# Patient Record
Sex: Male | Born: 1940 | Race: White | Hispanic: No | Marital: Married | Smoking: Former smoker
Health system: Southern US, Community
[De-identification: ages and names within clinical notes are randomized; demographics above are authoritative.]

## PROBLEM LIST (undated history)

## (undated) DIAGNOSIS — M109 Gout, unspecified: Secondary | ICD-10-CM

## (undated) DIAGNOSIS — I10 Essential (primary) hypertension: Secondary | ICD-10-CM

## (undated) DIAGNOSIS — Z8489 Family history of other specified conditions: Secondary | ICD-10-CM

## (undated) DIAGNOSIS — C189 Malignant neoplasm of colon, unspecified: Secondary | ICD-10-CM

## (undated) DIAGNOSIS — C801 Malignant (primary) neoplasm, unspecified: Secondary | ICD-10-CM

## (undated) DIAGNOSIS — E785 Hyperlipidemia, unspecified: Secondary | ICD-10-CM

## (undated) HISTORY — DX: Gout, unspecified: M10.9

## (undated) HISTORY — PX: LITHOTRIPSY: SUR834

## (undated) HISTORY — PX: PARTIAL COLECTOMY: SHX5273

## (undated) HISTORY — DX: Essential (primary) hypertension: I10

## (undated) HISTORY — PX: NASAL POLYP SURGERY: SHX186

## (undated) HISTORY — DX: Malignant (primary) neoplasm, unspecified: C80.1

## (undated) HISTORY — DX: Hyperlipidemia, unspecified: E78.5

---

## 2000-07-28 ENCOUNTER — Other Ambulatory Visit: Admission: RE | Admit: 2000-07-28 | Discharge: 2000-07-28 | Payer: Self-pay | Admitting: Gastroenterology

## 2000-08-01 ENCOUNTER — Encounter: Payer: Self-pay | Admitting: General Surgery

## 2000-08-06 ENCOUNTER — Inpatient Hospital Stay (HOSPITAL_COMMUNITY): Admission: RE | Admit: 2000-08-06 | Discharge: 2000-08-13 | Payer: Self-pay | Admitting: General Surgery

## 2003-10-13 ENCOUNTER — Ambulatory Visit: Payer: Self-pay | Admitting: Internal Medicine

## 2003-11-08 ENCOUNTER — Ambulatory Visit: Payer: Self-pay | Admitting: Internal Medicine

## 2004-10-11 ENCOUNTER — Ambulatory Visit: Payer: Self-pay | Admitting: Oncology

## 2004-11-07 ENCOUNTER — Ambulatory Visit: Payer: Self-pay | Admitting: Oncology

## 2005-10-24 ENCOUNTER — Ambulatory Visit: Payer: Self-pay | Admitting: Oncology

## 2005-11-07 ENCOUNTER — Ambulatory Visit: Payer: Self-pay | Admitting: Oncology

## 2007-11-23 ENCOUNTER — Ambulatory Visit: Payer: Self-pay | Admitting: Gastroenterology

## 2013-07-22 ENCOUNTER — Emergency Department: Payer: Self-pay | Admitting: Emergency Medicine

## 2013-07-22 LAB — URINALYSIS, COMPLETE
Bacteria: NONE SEEN
Bilirubin,UR: NEGATIVE
Glucose,UR: NEGATIVE mg/dL (ref 0–75)
Ketone: NEGATIVE
Leukocyte Esterase: NEGATIVE
Nitrite: NEGATIVE
Ph: 5 (ref 4.5–8.0)
Protein: NEGATIVE
RBC,UR: NONE SEEN /HPF (ref 0–5)
Specific Gravity: 1.005 (ref 1.003–1.030)
Squamous Epithelial: NONE SEEN
WBC UR: 1 /HPF (ref 0–5)

## 2013-07-22 LAB — COMPREHENSIVE METABOLIC PANEL
Albumin: 3.8 g/dL (ref 3.4–5.0)
Alkaline Phosphatase: 59 U/L
Anion Gap: 9 (ref 7–16)
BUN: 11 mg/dL (ref 7–18)
Bilirubin,Total: 0.6 mg/dL (ref 0.2–1.0)
Calcium, Total: 8.3 mg/dL — ABNORMAL LOW (ref 8.5–10.1)
Chloride: 104 mmol/L (ref 98–107)
Co2: 23 mmol/L (ref 21–32)
Creatinine: 1.57 mg/dL — ABNORMAL HIGH (ref 0.60–1.30)
EGFR (African American): 51 — ABNORMAL LOW
EGFR (Non-African Amer.): 44 — ABNORMAL LOW
Glucose: 124 mg/dL — ABNORMAL HIGH (ref 65–99)
Osmolality: 273 (ref 275–301)
Potassium: 4 mmol/L (ref 3.5–5.1)
SGOT(AST): 29 U/L (ref 15–37)
SGPT (ALT): 29 U/L (ref 12–78)
Sodium: 136 mmol/L (ref 136–145)
Total Protein: 6.9 g/dL (ref 6.4–8.2)

## 2013-07-22 LAB — CBC
HCT: 40.8 % (ref 40.0–52.0)
HGB: 13.6 g/dL (ref 13.0–18.0)
MCH: 32.6 pg (ref 26.0–34.0)
MCHC: 33.3 g/dL (ref 32.0–36.0)
MCV: 98 fL (ref 80–100)
Platelet: 213 10*3/uL (ref 150–440)
RBC: 4.16 10*6/uL — ABNORMAL LOW (ref 4.40–5.90)
RDW: 14.4 % (ref 11.5–14.5)
WBC: 11.9 10*3/uL — ABNORMAL HIGH (ref 3.8–10.6)

## 2013-07-23 ENCOUNTER — Ambulatory Visit: Payer: Self-pay | Admitting: Urology

## 2013-08-17 ENCOUNTER — Ambulatory Visit: Payer: Self-pay | Admitting: Urology

## 2013-08-19 ENCOUNTER — Ambulatory Visit: Payer: Self-pay | Admitting: Urology

## 2013-09-02 ENCOUNTER — Ambulatory Visit: Payer: Self-pay | Admitting: Urology

## 2014-04-30 NOTE — Op Note (Signed)
PATIENT NAME:  Edward Baird, Edward Baird MR#:  [id number] DATE OF BIRTH:  May 12, 1940  DATE OF PROCEDURE:  07/23/2013  PREOPERATIVE DIAGNOSIS: Left upper ureteral stone with obstruction.  POSTOPERATIVE DIAGNOSIS:  Left upper ureteral stone with obstruction.  PROCEDURE: Cystoscopy with a left stent placement.  PROCEDURE IN DETAIL:  With the patient sterilely prepped and draped in supine lithotomy position under good relaxation from general anesthesia, we do a standard timeout and determine the left ureter to be the target area.  The patient has been previously marked and has preoperative antibiotics. Cystoscopy is done utilizing a 21 French cystoscope with a four oblique lens. 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 Ureters are in normal position and the left ureter is instrumented with 0.035 Glidewire. Over the Glidewire, a Percuflex 26 cm stent was placed. Good position in the kidney and in the bladder determined by my scope. He has a 30 mL of Marcaine placed in and a B and O suppository. The markings in the bladder, the B and O suppositories in the rectum. He has a very small prostate on rectal exam. No rectal masses, tumors, or growths. He was sent to recovery with an empty bladder in satisfactory condition with a stent in good position by fluoroscopy.   ____________________________ Janice Coffin. Elnoria Howard, DO rdh:ds D: 07/23/2013 17:24:22 ET T: 07/23/2013 23:16:55 ET JOB#: [id number]  cc: Janice Coffin. Elnoria Howard, DO, <Dictator> Rekia Kujala D Blandon Offerdahl DO ELECTRONICALLY SIGNED 08/05/2013 14:07

## 2014-05-26 LAB — CBC AND DIFFERENTIAL
HEMATOCRIT: 44 % (ref 41–53)
HEMOGLOBIN: 15.2 g/dL (ref 13.5–17.5)
Neutrophils Absolute: 5 /uL
Platelets: 217 10*3/uL (ref 150–399)
WBC: 7 10*3/mL

## 2014-05-26 LAB — LIPID PANEL
Cholesterol: 262 mg/dL — AB (ref 0–200)
HDL: 88 mg/dL — AB (ref 35–70)
LDL Cholesterol: 148 mg/dL
LDl/HDL Ratio: 1.7
TRIGLYCERIDES: 131 mg/dL (ref 40–160)

## 2014-05-26 LAB — BASIC METABOLIC PANEL
BUN: 14 mg/dL (ref 4–21)
CREATININE: 1.3 mg/dL (ref 0.6–1.3)
Glucose: 99 mg/dL
POTASSIUM: 4.8 mmol/L (ref 3.4–5.3)
SODIUM: 140 mmol/L (ref 137–147)

## 2014-05-26 LAB — HEPATIC FUNCTION PANEL
ALK PHOS: 54 U/L (ref 25–125)
ALT: 22 U/L (ref 10–40)
AST: 20 U/L (ref 14–40)
BILIRUBIN, TOTAL: 0.6 mg/dL

## 2014-05-26 LAB — TSH: TSH: 2.15 u[IU]/mL (ref 0.41–5.90)

## 2014-05-26 LAB — PSA: PSA: 10

## 2014-08-23 ENCOUNTER — Other Ambulatory Visit: Payer: Self-pay

## 2014-09-23 ENCOUNTER — Ambulatory Visit: Payer: Self-pay | Admitting: Urology

## 2015-02-02 DIAGNOSIS — N400 Enlarged prostate without lower urinary tract symptoms: Secondary | ICD-10-CM | POA: Insufficient documentation

## 2015-02-02 DIAGNOSIS — M1990 Unspecified osteoarthritis, unspecified site: Secondary | ICD-10-CM | POA: Insufficient documentation

## 2015-02-02 DIAGNOSIS — R972 Elevated prostate specific antigen [PSA]: Secondary | ICD-10-CM | POA: Insufficient documentation

## 2015-02-02 DIAGNOSIS — M109 Gout, unspecified: Secondary | ICD-10-CM | POA: Insufficient documentation

## 2015-02-02 DIAGNOSIS — C189 Malignant neoplasm of colon, unspecified: Secondary | ICD-10-CM | POA: Insufficient documentation

## 2015-02-02 DIAGNOSIS — E7800 Pure hypercholesterolemia, unspecified: Secondary | ICD-10-CM | POA: Insufficient documentation

## 2015-02-02 DIAGNOSIS — C4490 Unspecified malignant neoplasm of skin, unspecified: Secondary | ICD-10-CM | POA: Insufficient documentation

## 2015-02-02 DIAGNOSIS — Z87442 Personal history of urinary calculi: Secondary | ICD-10-CM | POA: Insufficient documentation

## 2015-02-02 DIAGNOSIS — I10 Essential (primary) hypertension: Secondary | ICD-10-CM | POA: Insufficient documentation

## 2015-03-13 ENCOUNTER — Other Ambulatory Visit: Payer: Self-pay

## 2015-03-13 MED ORDER — LISINOPRIL 10 MG PO TABS: 10.0000 mg | ORAL_TABLET | Freq: Every day | ORAL | Status: DC

## 2015-03-13 MED ORDER — ALLOPURINOL 100 MG PO TABS: 100.0000 mg | ORAL_TABLET | Freq: Two times a day (BID) | ORAL | Status: DC

## 2015-03-13 NOTE — Telephone Encounter (Signed)
Patient was scheduled to come in for CPE on 03/14/15 but he is not due till after may 19th, will schedule been blocked off i was able to schedule patient for may 30th. He was going to be out of his medications so i sent enough refills till he has to come in-aa

## 2015-03-14 ENCOUNTER — Encounter: Payer: Self-pay | Admitting: Family Medicine

## 2015-03-14 DIAGNOSIS — C44529 Squamous cell carcinoma of skin of other part of trunk: Secondary | ICD-10-CM | POA: Diagnosis not present

## 2015-03-14 DIAGNOSIS — D485 Neoplasm of uncertain behavior of skin: Secondary | ICD-10-CM | POA: Diagnosis not present

## 2015-03-21 DIAGNOSIS — C44529 Squamous cell carcinoma of skin of other part of trunk: Secondary | ICD-10-CM | POA: Diagnosis not present

## 2015-03-21 DIAGNOSIS — L570 Actinic keratosis: Secondary | ICD-10-CM | POA: Diagnosis not present

## 2015-06-06 ENCOUNTER — Ambulatory Visit (INDEPENDENT_AMBULATORY_CARE_PROVIDER_SITE_OTHER): Payer: PPO | Admitting: Family Medicine

## 2015-06-06 ENCOUNTER — Encounter: Payer: Self-pay | Admitting: Family Medicine

## 2015-06-06 VITALS — BP 106/62 | HR 80 | Temp 97.6°F | Resp 16 | Ht 68.0 in | Wt 206.0 lb

## 2015-06-06 DIAGNOSIS — M109 Gout, unspecified: Secondary | ICD-10-CM

## 2015-06-06 DIAGNOSIS — R972 Elevated prostate specific antigen [PSA]: Secondary | ICD-10-CM

## 2015-06-06 DIAGNOSIS — Z0000 Encounter for general adult medical examination without abnormal findings: Secondary | ICD-10-CM | POA: Diagnosis not present

## 2015-06-06 DIAGNOSIS — I10 Essential (primary) hypertension: Secondary | ICD-10-CM

## 2015-06-06 DIAGNOSIS — E7800 Pure hypercholesterolemia, unspecified: Secondary | ICD-10-CM | POA: Diagnosis not present

## 2015-06-06 NOTE — Progress Notes (Signed)
Patient ID: Edward Baird, male   DOB: December 07, 1940, 75 y.o.   MRN: [id number] Patient: Edward Baird, Male    DOB: 01-May-1940, 75 y.o.   MRN: [id number] Visit Date: 06/06/2015  Today's Provider: Wilhemena Durie, MD   Chief Complaint  Patient presents with  . Annual Exam   Subjective:   Edward Baird is a 75 y.o. male who presents today for his Subsequent Annual Wellness Visit. He feels well. He reports exercising daily. He reports he is sleeping well. Pt stressed with marital separation of his daughter who has 3 children. He has no medical concerns and overall feels well. Review of Systems  Constitutional: Negative.   HENT: Negative.   Eyes: Negative.   Respiratory: Negative.   Cardiovascular: Negative.   Gastrointestinal: Negative.   Endocrine: Negative.   Genitourinary: Negative.   Musculoskeletal: Negative.   Skin: Negative.   Allergic/Immunologic: Negative.   Neurological: Negative.   Hematological: Negative.   Psychiatric/Behavioral: Negative.     Patient Active Problem List   Diagnosis Date Noted  . 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 Malignant neoplasm of colon ([place]) 02/02/2015  . Gout 02/02/2015  . Abnormal prostate specific antigen 02/02/2015  . Essential (primary) hypertension 02/02/2015  . Personal history of urinary calculi 02/02/2015  . Hypercholesteremia 02/02/2015  . Arthritis, degenerative 02/02/2015  . Malignant neoplasm of skin 02/02/2015    Social History   Social History  . Marital Status: Married    Spouse Name: N/A  . Number of Children: N/A  . Years of Education: N/A   Occupational History  . Not on file.   Social History Main Topics  . Smoking status: Not on file  . Smokeless tobacco: Not on file  . Alcohol Use: Not on file  . Drug Use: Not on file  . Sexual Activity: Not on file   Other Topics Concern  . Not on file   Social History Narrative  . No narrative on file    Past Surgical History  Procedure  Laterality Date  . Lithotripsy    . Partial colectomy    . Nasal polyp surgery      His family history includes Anemia in his sister; Dementia in his father; Heart disease in his father and mother; Hypertension in his mother.    Outpatient Prescriptions Prior to Visit  Medication Sig Dispense Refill  . allopurinol (ZYLOPRIM) 100 MG tablet Take 1 tablet (100 mg total) by mouth 2 (two) times daily. 60 tablet 2  . aspirin 81 MG tablet Take by mouth.    Marland Kitchen GARLIC 99991111 PO Take by mouth.    Marland Kitchen lisinopril (PRINIVIL,ZESTRIL) 10 MG tablet Take 1 tablet (10 mg total) by mouth daily. 30 tablet 2  . meloxicam (MOBIC) 7.5 MG tablet Take by mouth.    . Milk Thistle-Dand-Fennel-Licor (MILK THISTLE XTRA) CAPS Take by mouth.    . Misc Natural Products (SAW PALMETTO) CAPS Take by mouth.    . Omega-3 Fatty Acids (FISH OIL BURP-LESS) 1200 MG CAPS Take by mouth.     No facility-administered medications prior to visit.    No Known Allergies  Patient Care Team: Jerrol Banana., MD as PCP - General (Family Medicine)  Objective:   Vitals:  Filed Vitals:   06/06/15 1403  BP: 106/62  Pulse: 80  Temp: 97.6 F (36.4 C)  TempSrc: Oral  Resp: 16  Height: 5\' 8"  (1.727 m)  Weight: 206 lb (93.441 kg)    Physical Exam  Constitutional: He is oriented to person, place, and time. He appears well-developed and well-nourished.  HENT:  Head: Normocephalic and atraumatic.  Right Ear: External ear normal.  Left Ear: External ear normal.  Nose: Nose normal.  Mouth/Throat: Oropharynx is clear and moist.  Eyes: Conjunctivae and EOM are normal. Pupils are equal, round, and reactive to light.  Neck: Normal range of motion. Neck supple.  Cardiovascular: Normal rate, regular rhythm, normal heart sounds and intact distal pulses.   Pulmonary/Chest: Effort normal and breath sounds normal.  Abdominal: Soft. Bowel sounds are normal.  Musculoskeletal: Normal range of motion.  Neurological: He is alert and  oriented to person, place, and time.  Skin: Skin is warm and dry.  Psychiatric: He has a normal mood and affect. His behavior is normal. Judgment and thought content normal.    Activities of Daily Living In your present state of health, do you have any difficulty performing the following activities: 06/06/2015  Hearing? N  Vision? N  Difficulty concentrating or making decisions? N  Walking or climbing stairs? N  Dressing or bathing? N  Doing errands, shopping? N    Fall Risk Assessment Fall Risk  06/06/2015  Falls in the past year? No     Depression Screen PHQ 2/9 Scores 06/06/2015  PHQ - 2 Score 0    Cognitive Testing - 6-CIT    Year: 0 4 points  Month: 0 3 points  Memorize "Pia Mau, [address] [place]"  Time (within 1 hour:) 0 3 points  Count backwards from 20: 0 2 4 points  Name months of year: 0 2 4 points  Repeat Address: 0 2 4 6 8 10  points   Total Score: 6/28  Interpretation : Normal (0-7) Abnormal (8-28)    Assessment & Plan:     Annual Wellness Visit  Reviewed patient's Family Medical History Reviewed and updated list of patient's medical providers Assessment of cognitive impairment was done Assessed patient's functional ability Established a written schedule for health screening [place] Completed and Reviewed  Exercise Activities and Dietary recommendations Goals    None      Immunization History  Administered Date(s) Administered  . Influenza-Unspecified 10/21/2014  . Pneumococcal Conjugate-13 05/17/2013  . Pneumococcal Polysaccharide-23 10/08/2007  . Td 10/08/2007  . Zoster 10/08/2007    Health Maintenance  Topic Date Due  . COLONOSCOPY  03/14/1992  . INFLUENZA VACCINE  08/08/2015  . TETANUS/TDAP  10/07/2017  . ZOSTAVAX  Completed  . PNA vac Low Risk Adult  Completed      Discussed health benefits of physical activity, and encouraged him to engage in regular exercise appropriate for his age and  condition.   HTN Gout Colon cancer--s/p colectomy BPH Check labs.  Miguel Aschoff MD [facility] 06/06/2015 2:05 PM  ------------------------------------------------------------------------------------------------------------

## 2015-06-09 DIAGNOSIS — E7800 Pure hypercholesterolemia, unspecified: Secondary | ICD-10-CM | POA: Diagnosis not present

## 2015-06-09 DIAGNOSIS — I10 Essential (primary) hypertension: Secondary | ICD-10-CM | POA: Diagnosis not present

## 2015-06-10 LAB — LIPID PANEL WITH LDL/HDL RATIO
CHOLESTEROL TOTAL: 224 mg/dL — AB (ref 100–199)
HDL: 68 mg/dL (ref >39)
LDL CALC: 135 mg/dL — AB (ref 0–99)
LDl/HDL Ratio: 2 ratio units (ref 0.0–3.6)
Triglycerides: 107 mg/dL (ref 0–149)
VLDL Cholesterol Cal: 21 mg/dL (ref 5–40)

## 2015-06-10 LAB — CBC WITH DIFFERENTIAL/PLATELET
BASOS ABS: 0 10*3/uL (ref 0.0–0.2)
Basos: 1 %
EOS (ABSOLUTE): 0.2 10*3/uL (ref 0.0–0.4)
Eos: 4 %
HEMOGLOBIN: 14.2 g/dL (ref 12.6–17.7)
Hematocrit: 42.2 % (ref 37.5–51.0)
Immature Grans (Abs): 0 10*3/uL (ref 0.0–0.1)
Immature Granulocytes: 0 %
LYMPHS ABS: 1.5 10*3/uL (ref 0.7–3.1)
Lymphs: 25 %
MCH: 31.9 pg (ref 26.6–33.0)
MCHC: 33.6 g/dL (ref 31.5–35.7)
MCV: 95 fL (ref 79–97)
Monocytes Absolute: 0.6 10*3/uL (ref 0.1–0.9)
Monocytes: 9 %
NEUTROS ABS: 3.6 10*3/uL (ref 1.4–7.0)
Neutrophils: 61 %
PLATELETS: 215 10*3/uL (ref 150–379)
RBC: 4.45 x10E6/uL (ref 4.14–5.80)
RDW: 14.3 % (ref 12.3–15.4)
WBC: 5.9 10*3/uL (ref 3.4–10.8)

## 2015-06-10 LAB — COMPREHENSIVE METABOLIC PANEL
ALT: 19 IU/L (ref 0–44)
AST: 18 IU/L (ref 0–40)
Albumin/Globulin Ratio: 2 (ref 1.2–2.2)
Albumin: 4.5 g/dL (ref 3.5–4.8)
Alkaline Phosphatase: 52 IU/L (ref 39–117)
BUN/Creatinine Ratio: 12 (ref 10–24)
BUN: 15 mg/dL (ref 8–27)
Bilirubin Total: 0.6 mg/dL (ref 0.0–1.2)
CALCIUM: 9.3 mg/dL (ref 8.6–10.2)
CO2: 21 mmol/L (ref 18–29)
CREATININE: 1.3 mg/dL — AB (ref 0.76–1.27)
Chloride: 103 mmol/L (ref 96–106)
GFR calc Af Amer: 63 mL/min/{1.73_m2} (ref >59)
GFR, EST NON AFRICAN AMERICAN: 54 mL/min/{1.73_m2} — AB (ref >59)
GLOBULIN, TOTAL: 2.2 g/dL (ref 1.5–4.5)
Glucose: 113 mg/dL — ABNORMAL HIGH (ref 65–99)
Potassium: 4.6 mmol/L (ref 3.5–5.2)
SODIUM: 142 mmol/L (ref 134–144)
Total Protein: 6.7 g/dL (ref 6.0–8.5)

## 2015-06-10 LAB — URIC ACID: URIC ACID: 7.7 mg/dL (ref 3.7–8.6)

## 2015-06-10 LAB — TSH: TSH: 2.09 u[IU]/mL (ref 0.450–4.500)

## 2015-06-21 DIAGNOSIS — Z85828 Personal history of other malignant neoplasm of skin: Secondary | ICD-10-CM | POA: Diagnosis not present

## 2015-06-21 DIAGNOSIS — Z872 Personal history of diseases of the skin and subcutaneous tissue: Secondary | ICD-10-CM | POA: Diagnosis not present

## 2015-06-21 DIAGNOSIS — Z1283 Encounter for screening for malignant neoplasm of skin: Secondary | ICD-10-CM | POA: Diagnosis not present

## 2015-06-21 DIAGNOSIS — Z08 Encounter for follow-up examination after completed treatment for malignant neoplasm: Secondary | ICD-10-CM | POA: Diagnosis not present

## 2015-06-21 DIAGNOSIS — L570 Actinic keratosis: Secondary | ICD-10-CM | POA: Diagnosis not present

## 2015-06-27 ENCOUNTER — Other Ambulatory Visit: Payer: Self-pay | Admitting: Family Medicine

## 2015-07-17 ENCOUNTER — Other Ambulatory Visit: Payer: Self-pay | Admitting: Family Medicine

## 2015-11-22 IMAGING — CR DG ABDOMEN 1V
1 series · 1 of 1 positions shown · non-contrast
Comparison: KUB 08/17/2013.  CT abdomen and pelvis 07/22/2013.

CLINICAL DATA: Renal stones.

EXAM:
ABDOMEN - 1 VIEW

[kdxr kidney ureter bladder]
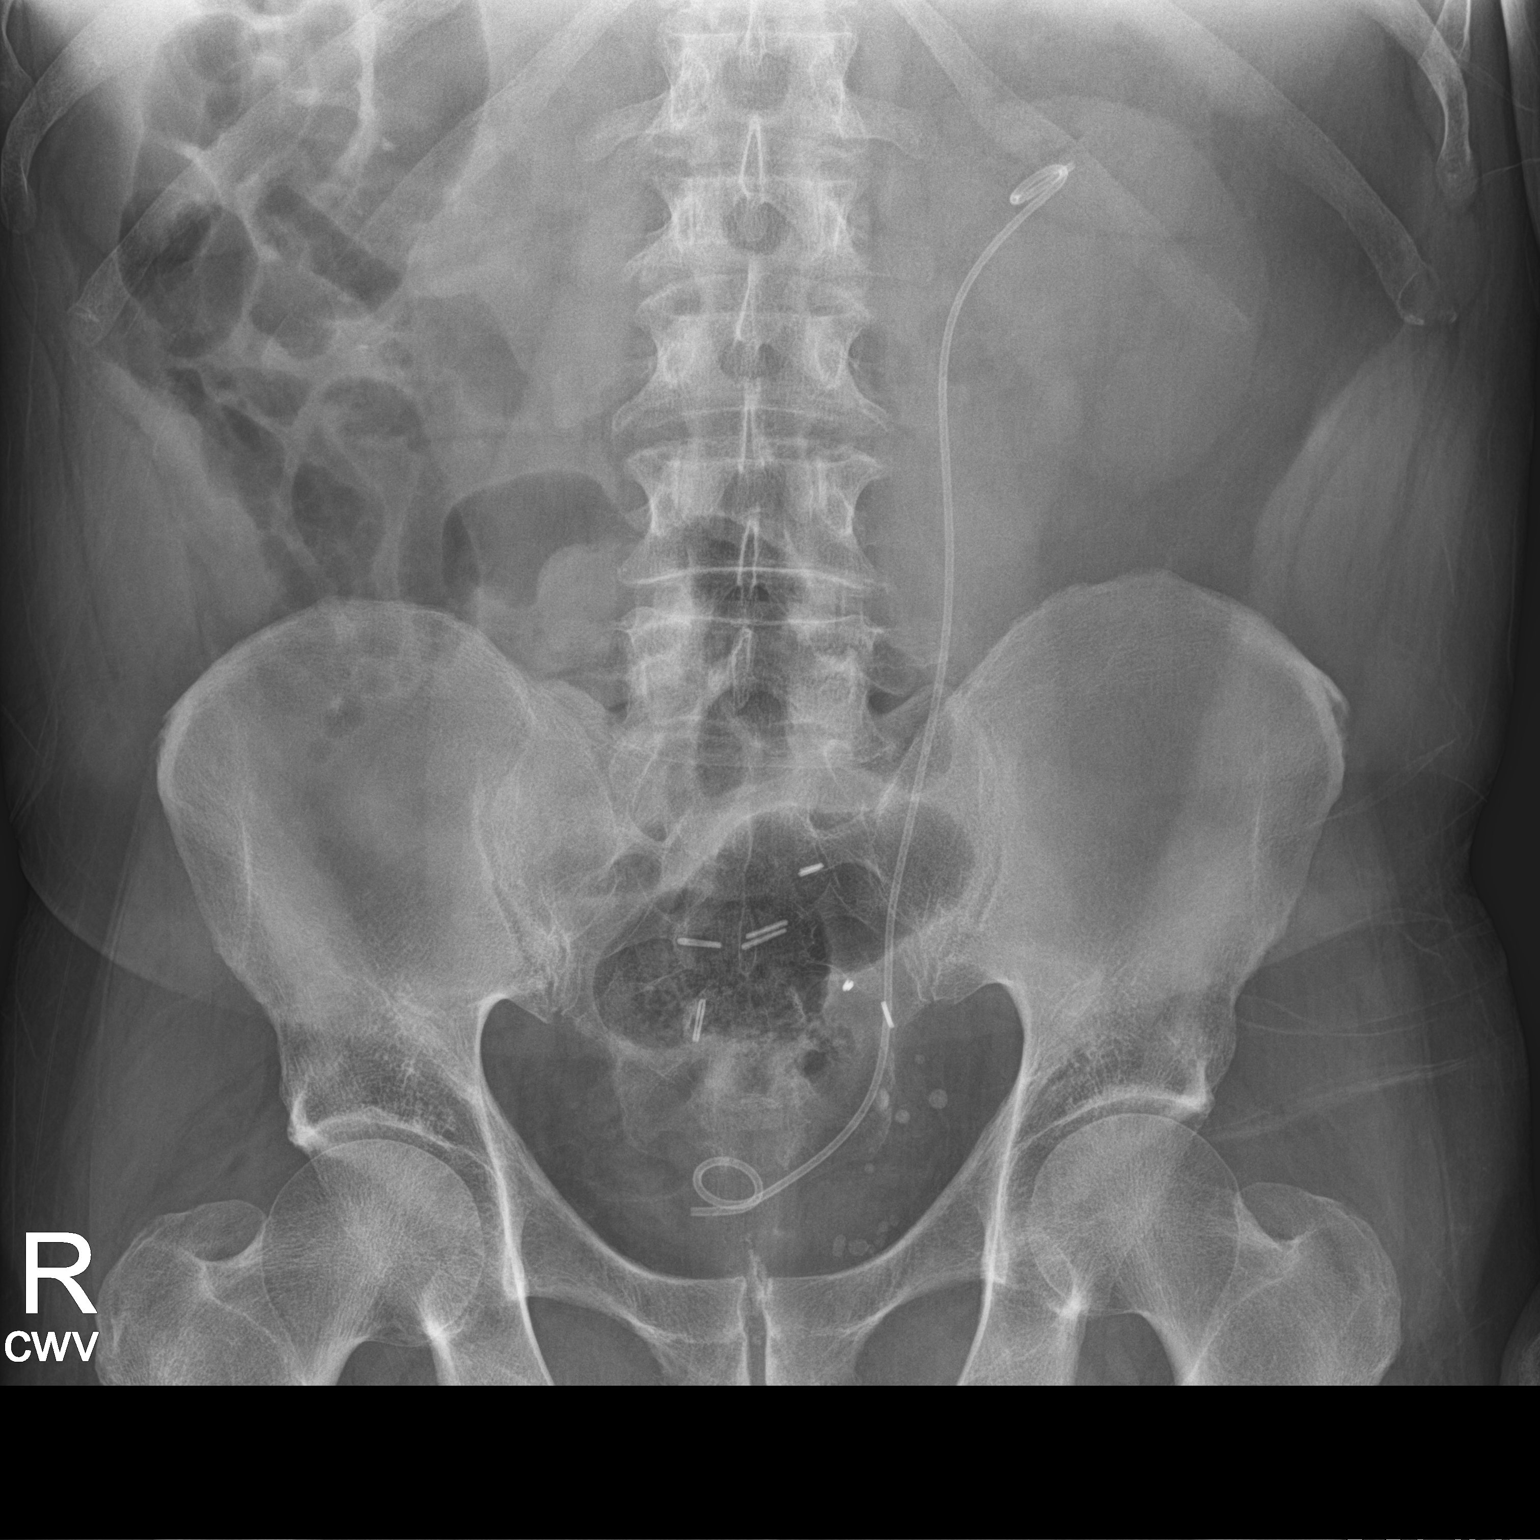

[1 of 1 positions shown; findings below may reference images not displayed]

FINDINGS: Left double-J ureteral stent is in place as on the most recent plain
film. Punctate calcifications in the right upper quadrant correlate
with nonobstructing right renal stones seen on CT scan. Tiny left
renal stones seen on CT are not visible on this examination. There
is a calcification along the patient's left double-J ureteral stent
just proximal to the UVJ which likely represents a distal left
ureteral stone rather than a phlebolith. The stone is not readily
seen on the prior examination.
IMPRESSION: A calcification is seen along the distal aspect of the patient's
left double-J ureteral stent. This is favored to represent a distal
left ureteral stone although the patient has multiple pelvic
phleboliths on the left.

Nonobstructing stones right kidney.

## 2016-01-16 ENCOUNTER — Ambulatory Visit (INDEPENDENT_AMBULATORY_CARE_PROVIDER_SITE_OTHER): Payer: PPO | Admitting: Family Medicine

## 2016-01-16 ENCOUNTER — Ambulatory Visit
Admission: RE | Admit: 2016-01-16 | Discharge: 2016-01-16 | Disposition: A | Discharge status: Home / Self Care | Payer: PPO | Source: Ambulatory Visit | Attending: Family Medicine | Admitting: Family Medicine

## 2016-01-16 ENCOUNTER — Encounter: Payer: Self-pay | Admitting: Family Medicine

## 2016-01-16 VITALS — BP 126/92 | HR 102 | Temp 97.9°F | Resp 16 | Wt 202.2 lb

## 2016-01-16 DIAGNOSIS — R1033 Periumbilical pain: Secondary | ICD-10-CM

## 2016-01-16 DIAGNOSIS — Z85038 Personal history of other malignant neoplasm of large intestine: Secondary | ICD-10-CM | POA: Diagnosis not present

## 2016-01-16 DIAGNOSIS — R197 Diarrhea, unspecified: Secondary | ICD-10-CM

## 2016-01-16 DIAGNOSIS — R10819 Abdominal tenderness, unspecified site: Secondary | ICD-10-CM | POA: Diagnosis not present

## 2016-01-16 DIAGNOSIS — R935 Abnormal findings on diagnostic imaging of other abdominal regions, including retroperitoneum: Secondary | ICD-10-CM | POA: Insufficient documentation

## 2016-01-16 NOTE — Progress Notes (Signed)
Patient: Edward Baird Male    DOB: 10-Jul-1940   76 y.o.   MRN: [id number] Visit Date: 01/16/2016  Today's Provider: Vernie Murders, PA   Chief Complaint  Patient presents with  . Emesis  . Diarrhea   Subjective:    Emesis   This is a new problem. Episode onset: diarrhea started on Thursday and vomitting started this morning. The problem occurs 2 to 4 times per day. The problem has been gradually worsening. The emesis has an appearance of bile (green). There has been no fever. Associated symptoms include abdominal pain and diarrhea. Associated symptoms comments: Loss of appetite,cramping . He has tried increased fluids for the symptoms. The treatment provided no relief.  Diarrhea   This is a new problem. Episode onset: Thursday. The problem occurs more than 10 times per day. The problem has been unchanged. The stool consistency is described as watery. Associated symptoms include abdominal pain and vomiting. There are no known risk factors. He has tried increased fluids for the symptoms. The treatment provided no relief. history of colon cancer in 2002   No past medical history on file. Patient Active Problem List   Diagnosis Date Noted  . 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 Malignant neoplasm of colon ([place]) 02/02/2015  . Gout 02/02/2015  . Abnormal prostate specific antigen 02/02/2015  . Essential (primary) hypertension 02/02/2015  . Personal history of urinary calculi 02/02/2015  . Hypercholesteremia 02/02/2015  . Arthritis, degenerative 02/02/2015  . Malignant neoplasm of skin 02/02/2015   Past Surgical History:  Procedure Laterality Date  . LITHOTRIPSY    . NASAL POLYP SURGERY    . PARTIAL COLECTOMY     Family History  Problem Relation Age of Onset  . Hypertension Mother   . Heart disease Mother   . Heart disease Father   . Dementia Father   . Anemia Sister    No Known Allergies   Previous Medications   ALLOPURINOL (ZYLOPRIM) 100 MG TABLET    Take 1 tablet  (100 mg total) by mouth 2 (two) times daily.   ASPIRIN 81 MG TABLET    Take by mouth.   GARLIC 99991111 PO    Take by mouth.   LISINOPRIL (PRINIVIL,ZESTRIL) 10 MG TABLET    Take 1 tablet (10 mg total) by mouth daily.   MELOXICAM (MOBIC) 7.5 MG TABLET    Take by mouth.   MILK THISTLE-DAND-FENNEL-LICOR (MILK THISTLE XTRA) CAPS    Take by mouth.   MISC NATURAL PRODUCTS (SAW PALMETTO) CAPS    Take by mouth.   OMEGA-3 FATTY ACIDS (FISH OIL BURP-LESS) 1200 MG CAPS    Take by mouth.    Review of Systems  Constitutional: Positive for appetite change.  Respiratory: Negative.   Cardiovascular: Negative.   Gastrointestinal: Positive for abdominal pain, diarrhea and vomiting.    Social History  Substance Use Topics  . Smoking status: Former Smoker    Quit date: 01/06/1994  . Smokeless tobacco: Not on file  . Alcohol use 0.0 oz/week     Comment: 10 per week   Objective:   BP (!) 126/92 (BP Location: Right Arm, Patient Position: Sitting, Cuff Size: Normal)   Pulse (!) 102   Temp 97.9 F (36.6 C) (Oral)   Resp 16   Wt 202 lb 3.2 oz (91.7 kg)   SpO2 98%   BMI 30.74 kg/m  Wt Readings from Last 3 Encounters:  01/16/16 202 lb 3.2 oz (91.7 kg)  06/06/15 206 lb (93.4  kg)  05/26/14 205 lb (93 kg)    Physical Exam  Constitutional: He is oriented to person, place, and time. He appears well-developed and well-nourished.  HENT:  Head: Normocephalic.  Right Ear: External ear normal.  Left Ear: External ear normal.  Nose: Nose normal.  Mouth/Throat: Oropharynx is clear and moist.  Neck: Neck supple.  Cardiovascular: Normal rate and regular rhythm.   Pulmonary/Chest: Effort normal and breath sounds normal.  Abdominal: Soft. He exhibits no mass. There is tenderness.  Generalized tenderness more prominent in the periumbilical region with slightly high pitches bowel sounds.  Lymphadenopathy:    He has no cervical adenopathy.  Neurological: He is alert and oriented to person, place, and time.    Psychiatric: He has a normal mood and affect. His behavior is normal. Thought content normal.      Assessment & Plan:     1. Periumbilical abdominal pain Onset 4-5 days ago with cramping and green diarrhea stools. Had some nausea and vomited green "bile" this morning 3 times. No longer feels as nauseated now. Abdominal pain is crampy. Usually has soft to liquid stools every 3 hours since having the majority of his colon removed due to colon cancer in 2015. No recent use of antibiotics. Feeling fatigued but no fever. Will check KUB for signs of obstruction, get labs to rule out infectious diarrhea and dehydration. May use the Imodium he normally uses to control multiple loose stools if x-ray doesn't show obstruction. Increase fluid intake and bland diet. Home to rest. - CBC with Differential/Platelet - Comprehensive metabolic panel - DG Abd 2 Views  2. Diarrhea, unspecified type Worsening with "green" color over the past few days. No blood in stools or fever. Will get stool culture, CBC and CMP. - CBC with Differential/Platelet - Comprehensive metabolic panel - Stool Culture  3. Hx of malignant neoplasm of colon "Only have about 8 inches left since resection for colon cancer." (surgeon - Dr. Rebekah Chesterfield in [place]).

## 2016-01-17 ENCOUNTER — Telehealth: Payer: Self-pay | Admitting: Family Medicine

## 2016-01-17 LAB — CBC WITH DIFFERENTIAL/PLATELET
BASOS ABS: 0 10*3/uL (ref 0.0–0.2)
Basos: 0 %
EOS (ABSOLUTE): 0.1 10*3/uL (ref 0.0–0.4)
Eos: 1 %
Hematocrit: 46.6 % (ref 37.5–51.0)
Hemoglobin: 15.8 g/dL (ref 13.0–17.7)
IMMATURE GRANS (ABS): 0 10*3/uL (ref 0.0–0.1)
Immature Granulocytes: 0 %
LYMPHS: 17 %
Lymphocytes Absolute: 1.2 10*3/uL (ref 0.7–3.1)
MCH: 31.5 pg (ref 26.6–33.0)
MCHC: 33.9 g/dL (ref 31.5–35.7)
MCV: 93 fL (ref 79–97)
Monocytes Absolute: 1.2 10*3/uL — ABNORMAL HIGH (ref 0.1–0.9)
Monocytes: 17 %
NEUTROS ABS: 4.9 10*3/uL (ref 1.4–7.0)
Neutrophils: 65 %
PLATELETS: 281 10*3/uL (ref 150–379)
RBC: 5.02 x10E6/uL (ref 4.14–5.80)
RDW: 13.4 % (ref 12.3–15.4)
WBC: 7.4 10*3/uL (ref 3.4–10.8)

## 2016-01-17 LAB — COMPREHENSIVE METABOLIC PANEL
ALT: 15 IU/L (ref 0–44)
AST: 14 IU/L (ref 0–40)
Albumin/Globulin Ratio: 1.8 (ref 1.2–2.2)
Albumin: 4.8 g/dL (ref 3.5–4.8)
Alkaline Phosphatase: 59 IU/L (ref 39–117)
BUN/Creatinine Ratio: 14 (ref 10–24)
BUN: 22 mg/dL (ref 8–27)
Bilirubin Total: 0.5 mg/dL (ref 0.0–1.2)
CALCIUM: 10.4 mg/dL — AB (ref 8.6–10.2)
CO2: 21 mmol/L (ref 18–29)
CREATININE: 1.52 mg/dL — AB (ref 0.76–1.27)
Chloride: 94 mmol/L — ABNORMAL LOW (ref 96–106)
GFR, EST AFRICAN AMERICAN: 52 mL/min/{1.73_m2} — AB (ref >59)
GFR, EST NON AFRICAN AMERICAN: 45 mL/min/{1.73_m2} — AB (ref >59)
GLUCOSE: 113 mg/dL — AB (ref 65–99)
Globulin, Total: 2.6 g/dL (ref 1.5–4.5)
Potassium: 5.1 mmol/L (ref 3.5–5.2)
Sodium: 134 mmol/L (ref 134–144)
Total Protein: 7.4 g/dL (ref 6.0–8.5)

## 2016-01-17 NOTE — Telephone Encounter (Signed)
Pt called wanting results from his labs.  Pt's call back is  [phone]  Thanks Con Memos

## 2016-01-18 MED ORDER — PROMETHAZINE HCL 25 MG PO TABS: 25.0000 mg | ORAL_TABLET | Freq: Three times a day (TID) | ORAL | 0 refills | Status: DC | PRN

## 2016-01-18 NOTE — Telephone Encounter (Signed)
RX sent to [facility]. Patient is aware.

## 2016-01-18 NOTE — Telephone Encounter (Signed)
Advised patient as below. Patient reports that he is feeling much better. However, he reports that he still has trouble with nausea and was hoping that you could send in something to help with this. Patient uses [facility] in [place]. Thanks!

## 2016-01-18 NOTE — Telephone Encounter (Signed)
Please review. Thanks!  

## 2016-01-18 NOTE — Telephone Encounter (Signed)
CBC normal. Some increase in creatinine and calcium. Suspect secondary to dehydration. Awaiting final report of stool culture. If abdominal pains worsening or persistent, should recheck in the office to be sure partial obstruction is clearing.

## 2016-01-18 NOTE — Telephone Encounter (Signed)
Promethazine 25 mg one TID by mouth prn nausea #12. Thanks!

## 2016-01-20 LAB — STOOL CULTURE: E COLI SHIGA TOXIN ASSAY: NEGATIVE

## 2016-06-19 ENCOUNTER — Ambulatory Visit (INDEPENDENT_AMBULATORY_CARE_PROVIDER_SITE_OTHER): Payer: PPO

## 2016-06-19 VITALS — BP 110/74 | HR 80 | Temp 98.7°F | Ht 68.0 in | Wt 198.8 lb

## 2016-06-19 DIAGNOSIS — Z0000 Encounter for general adult medical examination without abnormal findings: Secondary | ICD-10-CM

## 2016-06-19 NOTE — Progress Notes (Signed)
Subjective:   Edward Baird is a 76 y.o. male who presents for Medicare Annual/Subsequent preventive examination.  Review of Systems:  N/A  Cardiac Risk Factors include: advanced age (>3men, >64 women);dyslipidemia;hypertension;male gender;obesity (BMI >30kg/m2)     Objective:    Vitals: BP 110/74 (BP Location: Right Arm)   Pulse 80   Temp 98.7 F (37.1 C) (Oral)   Ht 5\' 8"  (1.727 m)   Wt 198 lb 12.8 oz (90.2 kg)   BMI 30.23 kg/m   Body mass index is 30.23 kg/m.  Tobacco History  Smoking Status  . Former Smoker  . Quit date: 01/06/1994  Smokeless Tobacco  . Former Systems developer  . Types: Snuff, Chew     Counseling given: Not Answered   Past Medical History:  Diagnosis Date  . Cancer ([place])    hx colon cancer- removed part of colon  . Gout   . Hyperlipidemia   . Hypertension    Past Surgical History:  Procedure Laterality Date  . LITHOTRIPSY    . NASAL POLYP SURGERY    . PARTIAL COLECTOMY     Family History  Problem Relation Age of Onset  . Hypertension Mother   . Heart disease Mother   . Heart disease Father   . Dementia Father   . Anemia Sister    History  Sexual Activity  . Sexual activity: Not on file    Outpatient Encounter Prescriptions as of 06/19/2016  Medication Sig  . allopurinol (ZYLOPRIM) 100 MG tablet Take 1 tablet (100 mg total) by mouth 2 (two) times daily.  Marland Kitchen aspirin 81 MG tablet Take by mouth.  . Cholecalciferol (VITAMIN D3) 1000 units CAPS Take by mouth daily.  Marland Kitchen lisinopril (PRINIVIL,ZESTRIL) 10 MG tablet Take 1 tablet (10 mg total) by mouth daily.  . Loperamide HCl (IMODIUM A-D PO) Take by mouth.  . meloxicam (MOBIC) 7.5 MG tablet Take by mouth.   . Milk Thistle-Dand-Fennel-Licor (MILK THISTLE XTRA) CAPS Take by mouth.  . Misc Natural Products (SAW PALMETTO) CAPS Take by mouth.  . Omega-3 Fatty Acids (FISH OIL BURP-LESS) 1200 MG CAPS Take by mouth.   . [DISCONTINUED] GARLIC 1914 PO Take by mouth.  . [DISCONTINUED] promethazine  (PHENERGAN) 25 MG tablet Take 1 tablet (25 mg total) by mouth every 8 (eight) hours as needed for nausea or vomiting.   No facility-administered encounter medications on file as of 06/19/2016.     Activities of Daily Living In your present state of health, do you have any difficulty performing the following activities: 06/19/2016  Hearing? N  Vision? N  Difficulty concentrating or making decisions? N  Walking or climbing stairs? N  Dressing or bathing? N  Doing errands, shopping? N  Preparing Food and eating ? N  Using the Toilet? N  In the past six months, have you accidently leaked urine? N  Do you have problems with loss of bowel control? N  Managing your Medications? N  Managing your Finances? N  Housekeeping or managing your Housekeeping? N  Some recent data might be hidden    Patient Care Team: Jerrol Banana., MD as PCP - General (Family Medicine) Anell Barr, OD as Consulting Physician (Optometry)   Assessment:     Exercise Activities and Dietary recommendations Current Exercise Habits: Home exercise routine, Type of exercise: walking;strength training/weights;stretching, Time (Minutes): 40, Frequency (Times/Week): 2, Weekly Exercise (Minutes/Week): 80, Intensity: Mild, Exercise limited by: None identified  Goals    . Continue water intake  Continue drinking 6-8 glasses of water a day.      Fall Risk Fall Risk  06/19/2016 06/06/2015  Falls in the past year? No No   Depression Screen PHQ 2/9 Scores 06/19/2016 06/19/2016 06/06/2015  PHQ - 2 Score 0 0 0  PHQ- 9 Score 0 - -    Cognitive Function     6CIT Screen 06/19/2016  What Year? 0 points  What month? 0 points  What time? 0 points  Count back from 20 0 points  Months in reverse 0 points  Repeat phrase 0 points  Total Score 0    Immunization History  Administered Date(s) Administered  . Influenza Split 12/05/2009, 12/10/2010  . Influenza,inj,quad, With Preservative 11/16/2015  .  Influenza-Unspecified 10/21/2014  . Pneumococcal Conjugate-13 05/17/2013  . Pneumococcal Polysaccharide-23 10/08/2007  . Td 10/08/2007  . Zoster 10/08/2007   Screening Tests Health Maintenance  Topic Date Due  . INFLUENZA VACCINE  08/07/2016  . TETANUS/TDAP  10/07/2017  . COLONOSCOPY  02/01/2025  . PNA vac Low Risk Adult  Completed      Plan:  I have personally reviewed and addressed the Medicare Annual Wellness questionnaire and have noted the following in the patient's chart:  A. Medical and social history B. Use of alcohol, tobacco or illicit drugs  C. Current medications and supplements D. Functional ability and status E.  Nutritional status F.  Physical activity G. Advance directives H. List of other physicians I.  Hospitalizations, surgeries, and ER visits in previous 12 months J.  [place] such as hearing and vision if needed, cognitive and depression L. Referrals and appointments - none  In addition, I have reviewed and discussed with patient certain preventive protocols, quality metrics, and best practice recommendations. A written personalized care plan for preventive services as well as general preventive health recommendations were provided to patient.  See attached scanned questionnaire for additional information.   Signed,  Fabio Neighbors, LPN Nurse Health Advisor   MD Recommendations: None.

## 2016-06-19 NOTE — Patient Instructions (Signed)
Edward Baird , Thank you for taking time to come for your Medicare Wellness Visit. I appreciate your ongoing commitment to your health goals. Please review the following plan we discussed and let me know if I can assist you in the future.   Screening recommendations/referrals: Colonoscopy: completed 02/02/15 Recommended yearly ophthalmology/optometry visit for glaucoma screening and checkup Recommended yearly dental visit for hygiene and checkup  Vaccinations: Influenza vaccine: up to date, due 09/2016 Pneumococcal vaccine: completed series Tdap vaccine: completed 10/08/07, due 10/2017 Shingles vaccine: completed 10/08/07  Advanced directives: Please bring a copy of your POA (Power of [place]) and/or Living Will to your next appointment.   Conditions/risks identified: Continue drinking 6-8 glasses of water a day.  Next appointment: 06/20/16 @ 9:00 AM  Preventive Care 65 Years and Older, Male Preventive care refers to lifestyle choices and visits with your health care provider that can promote health and wellness. What does preventive care include?  A yearly physical exam. This is also called an annual well check.  Dental exams once or twice a year.  Routine eye exams. Ask your health care provider how often you should have your eyes checked.  Personal lifestyle choices, including:  Daily care of your teeth and gums.  Regular physical activity.  Eating a healthy diet.  Avoiding tobacco and drug use.  Limiting alcohol use.  Practicing safe sex.  Taking low doses of aspirin every day.  Taking vitamin and mineral supplements as recommended by your health care provider. What happens during an annual well check? The services and screenings done by your health care provider during your annual well check will depend on your age, overall health, lifestyle risk factors, and family history of disease. Counseling  Your health care provider may ask you questions about  your:  Alcohol use.  Tobacco use.  Drug use.  Emotional well-being.  Home and relationship well-being.  Sexual activity.  Eating habits.  History of falls.  Memory and ability to understand (cognition).  Work and work Statistician. Screening  You may have the following tests or measurements:  Height, weight, and BMI.  Blood pressure.  Lipid and cholesterol levels. These may be checked every 5 years, or more frequently if you are over 71 years old.  Skin check.  Lung cancer screening. You may have this screening every year starting at age 1 if you have a 30-pack-year history of smoking and currently smoke or have quit within the past 15 years.  Fecal occult blood test (FOBT) of the stool. You may have this test every year starting at age 16.  Flexible sigmoidoscopy or colonoscopy. You may have a sigmoidoscopy every 5 years or a colonoscopy every 10 years starting at age 15.  Prostate cancer screening. Recommendations will vary depending on your family history and other risks.  Hepatitis C blood test.  Hepatitis B blood test.  Sexually transmitted disease (STD) testing.  Diabetes screening. This is done by checking your blood sugar (glucose) after you have not eaten for a while (fasting). You may have this done every 1-3 years.  Abdominal aortic aneurysm (AAA) screening. You may need this if you are a current or former smoker.  Osteoporosis. You may be screened starting at age 61 if you are at high risk. Talk with your health care provider about your test results, treatment options, and if necessary, the need for more tests. Vaccines  Your health care provider may recommend certain vaccines, such as:  Influenza vaccine. This is recommended every year.  Tetanus, diphtheria, and acellular pertussis (Tdap, Td) vaccine. You may need a Td booster every 10 years.  Zoster vaccine. You may need this after age 3.  Pneumococcal 13-valent conjugate (PCV13) vaccine.  One dose is recommended after age 82.  Pneumococcal polysaccharide (PPSV23) vaccine. One dose is recommended after age 60. Talk to your health care provider about which screenings and vaccines you need and how often you need them. This information is not intended to replace advice given to you by your health care provider. Make sure you discuss any questions you have with your health care provider. Document Released: 01/20/2015 Document Revised: 09/13/2015 Document Reviewed: 10/25/2014 Elsevier Interactive Patient Education  2017 [place] Prevention in the Home Falls can cause injuries. They can happen to people of all ages. There are many things you can do to make your home safe and to help prevent falls. What can I do on the outside of my home?  Regularly fix the edges of walkways and driveways and fix any cracks.  Remove anything that might make you trip as you walk through a door, such as a raised step or threshold.  Trim any bushes or trees on the path to your home.  Use bright outdoor lighting.  Clear any walking paths of anything that might make someone trip, such as rocks or tools.  Regularly check to see if handrails are loose or broken. Make sure that both sides of any steps have handrails.  Any raised decks and porches should have guardrails on the edges.  Have any leaves, snow, or ice cleared regularly.  Use sand or salt on walking paths during winter.  Clean up any spills in your garage right away. This includes oil or grease spills. What can I do in the bathroom?  Use night lights.  Install grab bars by the toilet and in the tub and shower. Do not use towel bars as grab bars.  Use non-skid mats or decals in the tub or shower.  If you need to sit down in the shower, use a plastic, non-slip stool.  Keep the floor dry. Clean up any water that spills on the floor as soon as it happens.  Remove soap buildup in the tub or shower regularly.  Attach bath  mats securely with double-sided non-slip rug tape.  Do not have throw rugs and other things on the floor that can make you trip. What can I do in the bedroom?  Use night lights.  Make sure that you have a light by your bed that is easy to reach.  Do not use any sheets or blankets that are too big for your bed. They should not hang down onto the floor.  Have a firm chair that has side arms. You can use this for support while you get dressed.  Do not have throw rugs and other things on the floor that can make you trip. What can I do in the kitchen?  Clean up any spills right away.  Avoid walking on wet floors.  Keep items that you use a lot in easy-to-reach places.  If you need to reach something above you, use a strong step stool that has a grab bar.  Keep electrical cords out of the way.  Do not use floor polish or wax that makes floors slippery. If you must use wax, use non-skid floor wax.  Do not have throw rugs and other things on the floor that can make you trip. What can I do  with my stairs?  Do not leave any items on the stairs.  Make sure that there are handrails on both sides of the stairs and use them. Fix handrails that are broken or loose. Make sure that handrails are as long as the stairways.  Check any carpeting to make sure that it is firmly attached to the stairs. Fix any carpet that is loose or worn.  Avoid having throw rugs at the top or bottom of the stairs. If you do have throw rugs, attach them to the floor with carpet tape.  Make sure that you have a light switch at the top of the stairs and the bottom of the stairs. If you do not have them, ask someone to add them for you. What else can I do to help prevent falls?  Wear shoes that:  Do not have high heels.  Have rubber bottoms.  Are comfortable and fit you well.  Are closed at the toe. Do not wear sandals.  If you use a stepladder:  Make sure that it is fully opened. Do not climb a closed  stepladder.  Make sure that both sides of the stepladder are locked into place.  Ask someone to hold it for you, if possible.  Clearly mark and make sure that you can see:  Any grab bars or handrails.  First and last steps.  Where the edge of each step is.  Use tools that help you move around (mobility aids) if they are needed. These include:  Canes.  Walkers.  Scooters.  Crutches.  Turn on the lights when you go into a dark area. Replace any light bulbs as soon as they burn out.  Set up your furniture so you have a clear path. Avoid moving your furniture around.  If any of your floors are uneven, fix them.  If there are any pets around you, be aware of where they are.  Review your medicines with your doctor. Some medicines can make you feel dizzy. This can increase your chance of falling. Ask your doctor what other things that you can do to help prevent falls. This information is not intended to replace advice given to you by your health care provider. Make sure you discuss any questions you have with your health care provider. Document Released: 10/20/2008 Document Revised: 06/01/2015 Document Reviewed: 01/28/2014 Elsevier Interactive Patient Education  2017 [employer].

## 2016-06-20 ENCOUNTER — Ambulatory Visit (INDEPENDENT_AMBULATORY_CARE_PROVIDER_SITE_OTHER): Payer: PPO | Admitting: Family Medicine

## 2016-06-20 ENCOUNTER — Encounter: Payer: Self-pay | Admitting: Family Medicine

## 2016-06-20 VITALS — BP 110/60 | HR 72 | Temp 97.5°F | Resp 16 | Ht 67.0 in | Wt 199.0 lb

## 2016-06-20 DIAGNOSIS — C187 Malignant neoplasm of sigmoid colon: Secondary | ICD-10-CM | POA: Diagnosis not present

## 2016-06-20 DIAGNOSIS — M1000 Idiopathic gout, unspecified site: Secondary | ICD-10-CM | POA: Diagnosis not present

## 2016-06-20 DIAGNOSIS — E7800 Pure hypercholesterolemia, unspecified: Secondary | ICD-10-CM | POA: Diagnosis not present

## 2016-06-20 DIAGNOSIS — R972 Elevated prostate specific antigen [PSA]: Secondary | ICD-10-CM

## 2016-06-20 DIAGNOSIS — Z0000 Encounter for general adult medical examination without abnormal findings: Secondary | ICD-10-CM | POA: Diagnosis not present

## 2016-06-20 DIAGNOSIS — I10 Essential (primary) hypertension: Secondary | ICD-10-CM

## 2016-06-20 LAB — IFOBT (OCCULT BLOOD): IMMUNOLOGICAL FECAL OCCULT BLOOD TEST: POSITIVE

## 2016-06-20 NOTE — Progress Notes (Signed)
Patient: Edward Baird, Male    DOB: May 31, 1940, 76 y.o.   MRN: [id number] Visit Date: 06/20/2016  Today's Provider: Wilhemena Durie, MD   Chief Complaint  Patient presents with  . Annual Exam   Subjective:    Annual physical exam Edward Baird is a 76 y.o. male who presents today for health maintenance and complete physical. He feels well. He reports exercising twice weekly; walks, stretches, light weight training. He also states he stays active. He reports he is sleeping well. He had his Medicare Annual Wellness Visit yesterday with the nurse health advisor. Pt quit smoking 1980. 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 -----------------------------------------------------------------   Review of Systems  Constitutional: Negative.   HENT: Negative.   Eyes: Negative.   Respiratory: Negative.   Cardiovascular: Negative.   Gastrointestinal: Negative.   Endocrine: Negative.   Genitourinary: Negative.   Musculoskeletal: Positive for arthralgias and neck stiffness. Negative for back pain, gait problem, joint swelling, myalgias and neck pain.  Skin: Negative.   Allergic/Immunologic: Negative.   Neurological: Negative.   Hematological: Negative.   Psychiatric/Behavioral: Negative.     Social History      He  reports that he quit smoking about 37 years ago. He quit smokeless tobacco use about 22 years ago. His smokeless tobacco use included Snuff and Chew. He reports that he does not drink alcohol or use drugs.       Social History   Social History  . Marital status: Married    Spouse name: Sherlynn Stalls   . Number of children: 2  . Years of education: GED   Occupational History  . Retired     Actor; also worked at [employer]  . Smoking status: Former Smoker    Quit date: 01/07/1979  . Smokeless tobacco: Former Systems developer    Types: Snuff, Chew    Quit date: 01/06/1994    . Alcohol use No  . Drug use: No  . Sexual activity: Yes    Birth control/ protection: None   Other Topics Concern  . None   Social History Narrative  . None    Past Medical History:  Diagnosis Date  . Cancer ([place])    hx colon cancer- removed part of colon  . Gout   . Hyperlipidemia   . Hypertension      Patient Active Problem List   Diagnosis Date Noted  . 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 Malignant neoplasm of colon ([place]) 02/02/2015  . Gout 02/02/2015  . Elevated PSA 02/02/2015  . Essential (primary) hypertension 02/02/2015  . Personal history of urinary calculi 02/02/2015  . Hypercholesteremia 02/02/2015  . Arthritis, degenerative 02/02/2015  . Malignant neoplasm of skin 02/02/2015    Past Surgical History:  Procedure Laterality Date  . LITHOTRIPSY    . NASAL POLYP SURGERY    . PARTIAL COLECTOMY      Family History        Family Status  Relation Status  . Mother Deceased at age 9  . Father Deceased  . Sister Deceased at age 14 year old       from flu  . Brother Alive  . Sister Alive  . Sister Alive  . Brother Alive        His family history includes Anemia in his sister; Aortic aneurysm in his brother; Dementia in his father; Heart disease in his father  and mother; Hypertension in his mother.     No Known Allergies   Current Outpatient Prescriptions:  .  allopurinol (ZYLOPRIM) 100 MG tablet, Take 1 tablet (100 mg total) by mouth 2 (two) times daily., Disp: 60 tablet, Rfl: 12 .  aspirin 81 MG tablet, Take by mouth., Disp: , Rfl:  .  Cholecalciferol (VITAMIN D3) 1000 units CAPS, Take by mouth daily., Disp: , Rfl:  .  lisinopril (PRINIVIL,ZESTRIL) 10 MG tablet, Take 1 tablet (10 mg total) by mouth daily., Disp: 30 tablet, Rfl: 12 .  Loperamide HCl (IMODIUM A-D PO), Take by mouth., Disp: , Rfl:  .  meloxicam (MOBIC) 7.5 MG tablet, Take by mouth. , Disp: , Rfl:  .  Milk Thistle-Dand-Fennel-Licor (MILK THISTLE XTRA) CAPS, Take by mouth., Disp:  , Rfl:  .  Misc Natural Products (SAW PALMETTO) CAPS, Take by mouth., Disp: , Rfl:  .  Omega-3 Fatty Acids (FISH OIL BURP-LESS) 1200 MG CAPS, Take by mouth. , Disp: , Rfl:    Patient Care Team: Jerrol Banana., MD as PCP - General (Family Medicine) Anell Barr, OD as Consulting Physician (Optometry)      Objective:   Vitals: BP 110/60 (BP Location: Left Arm, Patient Position: Sitting, Cuff Size: Large)   Pulse 72   Temp 97.5 F (36.4 C) (Oral)   Resp 16   Ht 5\' 7"  (1.702 m)   Wt 199 lb (90.3 kg)   BMI 31.17 kg/m    Vitals:   06/20/16 0907  BP: 110/60  Pulse: 72  Resp: 16  Temp: 97.5 F (36.4 C)  TempSrc: Oral  Weight: 199 lb (90.3 kg)  Height: 5\' 7"  (1.702 m)     Physical Exam  Constitutional: He is oriented to person, place, and time. He appears well-developed and well-nourished.  HENT:  Head: Normocephalic and atraumatic.  Right Ear: External ear normal.  Left Ear: External ear normal.  Nose: Nose normal.  Eyes: Conjunctivae are normal. No scleral icterus.  Neck: No thyromegaly present.  Cardiovascular: Normal rate, regular rhythm, normal heart sounds and intact distal pulses.   Pulmonary/Chest: Effort normal and breath sounds normal.  Musculoskeletal: Normal range of motion.  Neurological: He is alert and oriented to person, place, and time.  Skin: Skin is warm and dry.  Psychiatric: He has a normal mood and affect. His behavior is normal. Judgment and thought content normal.     Depression Screen PHQ 2/9 Scores 06/19/2016 06/19/2016 06/06/2015  PHQ - 2 Score 0 0 0  PHQ- 9 Score 0 - -      Assessment & Plan:     Routine Health Maintenance and Physical Exam  Exercise Activities and Dietary recommendations Goals    . Continue water intake          Continue drinking 6-8 glasses of water a day.       Immunization History  Administered Date(s) Administered  . Influenza Split 12/05/2009, 12/10/2010  . Influenza,inj,quad, With  Preservative 11/16/2015  . Influenza-Unspecified 10/21/2014  . Pneumococcal Conjugate-13 05/17/2013  . Pneumococcal Polysaccharide-23 10/08/2007  . Td 10/08/2007  . Zoster 10/08/2007    Health Maintenance  Topic Date Due  . INFLUENZA VACCINE  08/07/2016  . TETANUS/TDAP  10/07/2017  . COLONOSCOPY  02/01/2025  . PNA vac Low Risk Adult  Completed     Discussed health benefits of physical activity, and encouraged him to engage in regular exercise appropriate for his age and condition.  HTN HLD Gout H/o Colon Cancer  Elevated PSA   --------------------------------------------------------------------   I have done the exam and reviewed the above chart and it is accurate to the best of my knowledge. Development worker, community has been used in this note in any air is in the dictation or transcription are unintentional.  Wilhemena Durie, MD  [facility]

## 2016-06-21 LAB — COMPREHENSIVE METABOLIC PANEL
A/G RATIO: 2.1 (ref 1.2–2.2)
ALBUMIN: 4.6 g/dL (ref 3.5–4.8)
ALT: 17 IU/L (ref 0–44)
AST: 22 IU/L (ref 0–40)
Alkaline Phosphatase: 55 IU/L (ref 39–117)
BUN/Creatinine Ratio: 17 (ref 10–24)
BUN: 18 mg/dL (ref 8–27)
Bilirubin Total: 0.6 mg/dL (ref 0.0–1.2)
CO2: 21 mmol/L (ref 20–29)
CREATININE: 1.06 mg/dL (ref 0.76–1.27)
Calcium: 9.6 mg/dL (ref 8.6–10.2)
Chloride: 104 mmol/L (ref 96–106)
GFR, EST AFRICAN AMERICAN: 80 mL/min/{1.73_m2} (ref >59)
GFR, EST NON AFRICAN AMERICAN: 69 mL/min/{1.73_m2} (ref >59)
GLOBULIN, TOTAL: 2.2 g/dL (ref 1.5–4.5)
Glucose: 101 mg/dL — ABNORMAL HIGH (ref 65–99)
POTASSIUM: 5.1 mmol/L (ref 3.5–5.2)
SODIUM: 140 mmol/L (ref 134–144)
TOTAL PROTEIN: 6.8 g/dL (ref 6.0–8.5)

## 2016-06-21 LAB — PSA: Prostate Specific Ag, Serum: 14 ng/mL — ABNORMAL HIGH (ref 0.0–4.0)

## 2016-06-21 LAB — URIC ACID: Uric Acid: 7.2 mg/dL (ref 3.7–8.6)

## 2016-06-21 LAB — CBC WITH DIFFERENTIAL/PLATELET
BASOS: 1 %
Basophils Absolute: 0 10*3/uL (ref 0.0–0.2)
EOS (ABSOLUTE): 0.1 10*3/uL (ref 0.0–0.4)
EOS: 1 %
HEMATOCRIT: 42.6 % (ref 37.5–51.0)
HEMOGLOBIN: 14.4 g/dL (ref 13.0–17.7)
IMMATURE GRANS (ABS): 0 10*3/uL (ref 0.0–0.1)
Immature Granulocytes: 0 %
LYMPHS: 24 %
Lymphocytes Absolute: 1.7 10*3/uL (ref 0.7–3.1)
MCH: 31.5 pg (ref 26.6–33.0)
MCHC: 33.8 g/dL (ref 31.5–35.7)
MCV: 93 fL (ref 79–97)
MONOCYTES: 8 %
MONOS ABS: 0.6 10*3/uL (ref 0.1–0.9)
NEUTROS PCT: 66 %
Neutrophils Absolute: 4.8 10*3/uL (ref 1.4–7.0)
Platelets: 252 10*3/uL (ref 150–379)
RBC: 4.57 x10E6/uL (ref 4.14–5.80)
RDW: 14.8 % (ref 12.3–15.4)
WBC: 7.4 10*3/uL (ref 3.4–10.8)

## 2016-06-21 LAB — LIPID PANEL
CHOL/HDL RATIO: 3 ratio (ref 0.0–5.0)
Cholesterol, Total: 200 mg/dL — ABNORMAL HIGH (ref 100–199)
HDL: 67 mg/dL (ref >39)
LDL CALC: 117 mg/dL — AB (ref 0–99)
Triglycerides: 79 mg/dL (ref 0–149)
VLDL Cholesterol Cal: 16 mg/dL (ref 5–40)

## 2016-06-21 LAB — TSH: TSH: 2.53 u[IU]/mL (ref 0.450–4.500)

## 2016-06-24 ENCOUNTER — Other Ambulatory Visit: Payer: Self-pay

## 2016-06-24 DIAGNOSIS — R972 Elevated prostate specific antigen [PSA]: Secondary | ICD-10-CM

## 2016-06-24 NOTE — Progress Notes (Unsigned)
Ref to uro  

## 2016-06-24 NOTE — Progress Notes (Signed)
Patient advised and referral ordered. ED

## 2016-07-15 DIAGNOSIS — L570 Actinic keratosis: Secondary | ICD-10-CM | POA: Diagnosis not present

## 2016-07-19 ENCOUNTER — Ambulatory Visit: Payer: PPO

## 2016-07-22 ENCOUNTER — Ambulatory Visit: Payer: PPO

## 2016-09-06 ENCOUNTER — Other Ambulatory Visit: Payer: Self-pay | Admitting: Family Medicine

## 2017-05-22 DIAGNOSIS — E785 Hyperlipidemia, unspecified: Secondary | ICD-10-CM | POA: Diagnosis not present

## 2017-05-22 DIAGNOSIS — M7502 Adhesive capsulitis of left shoulder: Secondary | ICD-10-CM | POA: Diagnosis not present

## 2017-05-22 DIAGNOSIS — C189 Malignant neoplasm of colon, unspecified: Secondary | ICD-10-CM | POA: Diagnosis not present

## 2017-05-22 DIAGNOSIS — M1A00X0 Idiopathic chronic gout, unspecified site, without tophus (tophi): Secondary | ICD-10-CM | POA: Diagnosis not present

## 2017-05-29 ENCOUNTER — Telehealth: Payer: Self-pay

## 2017-05-29 NOTE — Telephone Encounter (Signed)
LM for pt to CB to schedule AWV (and CPE) after 06/19/17. -MM

## 2017-05-29 NOTE — Telephone Encounter (Signed)
Per wife- pt is no longer a pt here due to insurance change. Will remove from call list. -MM

## 2017-06-18 DIAGNOSIS — M7582 Other shoulder lesions, left shoulder: Secondary | ICD-10-CM | POA: Diagnosis not present

## 2017-06-18 DIAGNOSIS — M19112 Post-traumatic osteoarthritis, left shoulder: Secondary | ICD-10-CM | POA: Diagnosis not present

## 2017-06-18 DIAGNOSIS — M7502 Adhesive capsulitis of left shoulder: Secondary | ICD-10-CM | POA: Diagnosis not present

## 2017-07-22 DIAGNOSIS — H2513 Age-related nuclear cataract, bilateral: Secondary | ICD-10-CM | POA: Diagnosis not present

## 2017-07-22 DIAGNOSIS — H02886 Meibomian gland dysfunction of left eye, unspecified eyelid: Secondary | ICD-10-CM | POA: Diagnosis not present

## 2017-07-22 DIAGNOSIS — H02883 Meibomian gland dysfunction of right eye, unspecified eyelid: Secondary | ICD-10-CM | POA: Diagnosis not present

## 2017-10-14 DIAGNOSIS — Z125 Encounter for screening for malignant neoplasm of prostate: Secondary | ICD-10-CM | POA: Diagnosis not present

## 2017-10-14 DIAGNOSIS — R110 Nausea: Secondary | ICD-10-CM | POA: Diagnosis not present

## 2017-10-14 DIAGNOSIS — N401 Enlarged prostate with lower urinary tract symptoms: Secondary | ICD-10-CM | POA: Diagnosis not present

## 2017-10-14 DIAGNOSIS — N23 Unspecified renal colic: Secondary | ICD-10-CM | POA: Diagnosis not present

## 2017-10-14 DIAGNOSIS — R351 Nocturia: Secondary | ICD-10-CM | POA: Diagnosis not present

## 2017-10-14 DIAGNOSIS — R112 Nausea with vomiting, unspecified: Secondary | ICD-10-CM | POA: Diagnosis not present

## 2017-10-16 DIAGNOSIS — N209 Urinary calculus, unspecified: Secondary | ICD-10-CM | POA: Diagnosis not present

## 2017-10-20 DIAGNOSIS — N209 Urinary calculus, unspecified: Secondary | ICD-10-CM | POA: Diagnosis not present

## 2017-10-22 MED ORDER — LEVOFLOXACIN 500 MG PO TABS
500.0000 mg | ORAL_TABLET | ORAL | Status: AC
  Administered 2017-10-23: 500 mg via ORAL

## 2017-10-22 NOTE — Progress Notes (Signed)
Per Dr. Yves Dill okay to proceed with Morphine dose at 10mg  IM.

## 2017-10-23 ENCOUNTER — Encounter
Admission: RE | Disposition: A | Discharge status: Home / Self Care | Payer: Self-pay | Source: Ambulatory Visit | Attending: Urology

## 2017-10-23 ENCOUNTER — Other Ambulatory Visit: Payer: Self-pay

## 2017-10-23 ENCOUNTER — Ambulatory Visit
Admission: RE | Admit: 2017-10-23 | Discharge: 2017-10-23 | Disposition: A | Discharge status: Home / Self Care | Payer: PPO | Source: Ambulatory Visit | Attending: Urology | Admitting: Urology

## 2017-10-23 DIAGNOSIS — Z85038 Personal history of other malignant neoplasm of large intestine: Secondary | ICD-10-CM | POA: Insufficient documentation

## 2017-10-23 DIAGNOSIS — N201 Calculus of ureter: Secondary | ICD-10-CM | POA: Diagnosis not present

## 2017-10-23 DIAGNOSIS — Z7901 Long term (current) use of anticoagulants: Secondary | ICD-10-CM | POA: Diagnosis not present

## 2017-10-23 DIAGNOSIS — Z7982 Long term (current) use of aspirin: Secondary | ICD-10-CM | POA: Diagnosis not present

## 2017-10-23 DIAGNOSIS — Z87891 Personal history of nicotine dependence: Secondary | ICD-10-CM | POA: Insufficient documentation

## 2017-10-23 HISTORY — PX: EXTRACORPOREAL SHOCK WAVE LITHOTRIPSY: SHX1557

## 2017-10-23 SURGERY — LITHOTRIPSY, ESWL
Anesthesia: Moderate Sedation | Laterality: Right

## 2017-10-23 MED ORDER — DIPHENHYDRAMINE HCL 25 MG PO CAPS
25.0000 mg | ORAL_CAPSULE | ORAL | Status: AC
  Administered 2017-10-23: 25 mg via ORAL

## 2017-10-23 MED ORDER — MIDAZOLAM HCL 2 MG/2ML IJ SOLN
INTRAMUSCULAR | Status: AC
  Administered 2017-10-23: 1 mg via INTRAMUSCULAR
  Filled 2017-10-23: qty 2

## 2017-10-23 MED ORDER — DIPHENHYDRAMINE HCL 25 MG PO CAPS
ORAL_CAPSULE | ORAL | Status: AC
  Administered 2017-10-23: 25 mg via ORAL
  Filled 2017-10-23: qty 1

## 2017-10-23 MED ORDER — MORPHINE SULFATE (PF) 10 MG/ML IV SOLN
INTRAVENOUS | Status: AC
  Administered 2017-10-23: 10 mg via INTRAMUSCULAR
  Filled 2017-10-23: qty 1

## 2017-10-23 MED ORDER — PROMETHAZINE HCL 25 MG/ML IJ SOLN
25.0000 mg | Freq: Once | INTRAMUSCULAR | Status: AC
  Administered 2017-10-23: 25 mg via INTRAMUSCULAR

## 2017-10-23 MED ORDER — PROMETHAZINE HCL 25 MG/ML IJ SOLN
INTRAMUSCULAR | Status: AC
  Administered 2017-10-23: 25 mg via INTRAMUSCULAR
  Filled 2017-10-23: qty 1

## 2017-10-23 MED ORDER — LEVOFLOXACIN 500 MG PO TABS
ORAL_TABLET | ORAL | Status: AC
  Administered 2017-10-23: 500 mg via ORAL
  Filled 2017-10-23: qty 1

## 2017-10-23 MED ORDER — MORPHINE SULFATE (PF) 10 MG/ML IV SOLN
10.0000 mg | Freq: Once | INTRAVENOUS | Status: AC
  Administered 2017-10-23: 10 mg via INTRAMUSCULAR

## 2017-10-23 MED ORDER — DEXTROSE-NACL 5-0.45 % IV SOLN
INTRAVENOUS | Status: DC
  Administered 2017-10-23: 12:00:00 via INTRAVENOUS

## 2017-10-23 MED ORDER — MIDAZOLAM HCL 2 MG/2ML IJ SOLN
1.0000 mg | Freq: Once | INTRAMUSCULAR | Status: AC
  Administered 2017-10-23: 1 mg via INTRAMUSCULAR

## 2017-10-23 NOTE — Progress Notes (Signed)
Pt with no procedure done, Dr Yves Dill spoke with pt and his wife, Pt discharged

## 2017-10-23 NOTE — Discharge Instructions (Addendum)
AMBULATORY SURGERY  DISCHARGE INSTRUCTIONS   1) The drugs that you were given will stay in your system until tomorrow so for the next 24 hours you should not:  A) Drive an automobile B) Make any legal decisions C) Drink any alcoholic beverage   2) You may resume regular meals tomorrow.  Today it is better to start with liquids and gradually work up to solid foods.  You may eat anything you prefer, but it is better to start with liquids, then soup and crackers, and gradually work up to solid foods.   3) Please notify your doctor immediately if you have any unusual bleeding, trouble breathing, redness and pain at the surgery site, drainage, fever, or pain not relieved by medication.    4) Additional Instructions:        Please contact your physician with any problems or Same Day Surgery at [phone], Monday through Friday 6 am to 4 pm, or [place] at [facility] number at [phone].Dietary Guidelines to Help Prevent Kidney Stones Kidney stones are deposits of minerals and salts that form inside your kidneys. Your risk of developing kidney stones may be greater depending on your diet, your lifestyle, the medicines you take, and whether you have certain medical conditions. Most people can reduce their chances of developing kidney stones by following the instructions below. Depending on your overall health and the type of kidney stones you tend to develop, your dietitian may give you more specific instructions. What are tips for following this plan? Reading food labels  Choose foods with "no salt added" or "low-salt" labels. Limit your sodium intake to less than 1500 mg per day.  Choose foods with calcium for each meal and snack. Try to eat about 300 mg of calcium at each meal. Foods that contain 200-500 mg of calcium per serving include: ? 8 oz (237 ml) of milk, fortified nondairy milk, and fortified fruit juice. ? 8 oz (237 ml) of kefir, yogurt, and soy yogurt. ? 4 oz  (118 ml) of tofu. ? 1 oz of cheese. ? 1 cup (300 g) of dried figs. ? 1 cup (91 g) of cooked broccoli. ? 1-3 oz can of sardines or mackerel.  Most people need 1000 to 1500 mg of calcium each day. Talk to your dietitian about how much calcium is recommended for you. Shopping  Buy plenty of fresh fruits and vegetables. Most people do not need to avoid fruits and vegetables, even if they contain nutrients that may contribute to kidney stones.  When shopping for convenience foods, choose: ? Whole pieces of fruit. ? Premade salads with dressing on the side. ? Low-fat fruit and yogurt smoothies.  Avoid buying frozen meals or prepared deli foods.  Look for foods with live cultures, such as yogurt and kefir. Cooking  Do not add salt to food when cooking. Place a salt shaker on the table and allow each person to add his or her own salt to taste.  Use vegetable protein, such as beans, textured vegetable protein (TVP), or tofu instead of meat in pasta, casseroles, and soups. Meal planning  Eat less salt, if told by your dietitian. To do this: ? Avoid eating processed or premade food. ? Avoid eating fast food.  Eat less animal protein, including cheese, meat, poultry, or fish, if told by your dietitian. To do this: ? Limit the number of times you have meat, poultry, fish, or cheese each week. Eat a diet free of meat at least 2 days a  week. ? Eat only one serving each day of meat, poultry, fish, or seafood. ? When you prepare animal protein, cut pieces into small portion sizes. For most meat and fish, one serving is about the size of one deck of cards.  Eat at least 5 servings of fresh fruits and vegetables each day. To do this: ? Keep fruits and vegetables on hand for snacks. ? Eat 1 piece of fruit or a handful of berries with breakfast. ? Have a salad and fruit at lunch. ? Have two kinds of vegetables at dinner.  Limit foods that are high in a substance called oxalate. These  include: ? Spinach. ? Rhubarb. ? Beets. ? Potato chips and french fries. ? Nuts.  If you regularly take a diuretic medicine, make sure to eat at least 1-2 fruits or vegetables high in potassium each day. These include: ? Avocado. ? Banana. ? Orange, prune, carrot, or tomato juice. ? Baked potato. ? Cabbage. ? Beans and split peas. General instructions  Drink enough fluid to keep your urine clear or pale yellow. This is the most important thing you can do.  Talk to your health care provider and dietitian about taking daily supplements. Depending on your health and the cause of your kidney stones, you may be advised: ? Not to take supplements with vitamin C. ? To take a calcium supplement. ? To take a daily probiotic supplement. ? To take other supplements such as magnesium, fish oil, or vitamin B6.  Take all medicines and supplements as told by your health care provider.  Limit alcohol intake to no more than 1 drink a day for nonpregnant women and 2 drinks a day for men. One drink equals 12 oz of beer, 5 oz of wine, or 1 oz of hard liquor.  Lose weight if told by your health care provider. Work with your dietitian to find strategies and an eating plan that works best for you. What foods are not recommended? Limit your intake of the following foods, or as told by your dietitian. Talk to your dietitian about specific foods you should avoid based on the type of kidney stones and your overall health. Grains Breads. Bagels. Rolls. Baked goods. Salted crackers. Cereal. Pasta. Vegetables Spinach. Rhubarb. Beets. Canned vegetables. Angie Fava. Olives. Meats and other protein foods Nuts. Nut butters. Large portions of meat, poultry, or fish. Salted or cured meats. Deli meats. Hot dogs. Sausages. Dairy Cheese. Beverages Regular soft drinks. Regular vegetable juice. Seasonings and other foods Seasoning blends with salt. Salad dressings. Canned soups. Soy sauce. Ketchup. Barbecue sauce.  Canned pasta sauce. Casseroles. Pizza. Lasagna. Frozen meals. Potato chips. [place] fries. Summary  You can reduce your risk of kidney stones by making changes to your diet.  The most important thing you can do is drink enough fluid. You should drink enough fluid to keep your urine clear or pale yellow.  Ask your health care provider or dietitian how much protein from animal sources you should eat each day, and also how much salt and calcium you should have each day. This information is not intended to replace advice given to you by your health care provider. Make sure you discuss any questions you have with your health care provider. Document Released: 04/20/2010 Document Revised: 12/05/2015 Document Reviewed: 12/05/2015 Elsevier Interactive Patient Education  [employer].

## 2017-10-24 ENCOUNTER — Encounter: Payer: Self-pay | Admitting: Urology

## 2019-03-02 DIAGNOSIS — H02886 Meibomian gland dysfunction of left eye, unspecified eyelid: Secondary | ICD-10-CM | POA: Diagnosis not present

## 2019-03-02 DIAGNOSIS — H02883 Meibomian gland dysfunction of right eye, unspecified eyelid: Secondary | ICD-10-CM | POA: Diagnosis not present

## 2019-03-02 DIAGNOSIS — H2513 Age-related nuclear cataract, bilateral: Secondary | ICD-10-CM | POA: Diagnosis not present

## 2020-06-02 DIAGNOSIS — M1711 Unilateral primary osteoarthritis, right knee: Secondary | ICD-10-CM | POA: Diagnosis not present

## 2020-10-09 DIAGNOSIS — R7309 Other abnormal glucose: Secondary | ICD-10-CM | POA: Diagnosis not present

## 2020-10-09 DIAGNOSIS — E785 Hyperlipidemia, unspecified: Secondary | ICD-10-CM | POA: Diagnosis not present

## 2020-10-09 DIAGNOSIS — M170 Bilateral primary osteoarthritis of knee: Secondary | ICD-10-CM | POA: Diagnosis not present

## 2020-10-09 DIAGNOSIS — C189 Malignant neoplasm of colon, unspecified: Secondary | ICD-10-CM | POA: Diagnosis not present

## 2020-10-09 DIAGNOSIS — Z0000 Encounter for general adult medical examination without abnormal findings: Secondary | ICD-10-CM | POA: Diagnosis not present

## 2020-10-09 DIAGNOSIS — M1A9XX0 Chronic gout, unspecified, without tophus (tophi): Secondary | ICD-10-CM | POA: Diagnosis not present

## 2020-10-09 DIAGNOSIS — N400 Enlarged prostate without lower urinary tract symptoms: Secondary | ICD-10-CM | POA: Diagnosis not present

## 2020-11-22 DIAGNOSIS — M1711 Unilateral primary osteoarthritis, right knee: Secondary | ICD-10-CM | POA: Diagnosis not present

## 2020-11-22 DIAGNOSIS — M25561 Pain in right knee: Secondary | ICD-10-CM | POA: Diagnosis not present

## 2020-11-28 DIAGNOSIS — M1711 Unilateral primary osteoarthritis, right knee: Secondary | ICD-10-CM | POA: Diagnosis not present

## 2020-12-05 DIAGNOSIS — M1711 Unilateral primary osteoarthritis, right knee: Secondary | ICD-10-CM | POA: Diagnosis not present

## 2020-12-12 DIAGNOSIS — M1711 Unilateral primary osteoarthritis, right knee: Secondary | ICD-10-CM | POA: Diagnosis not present

## 2021-03-13 DIAGNOSIS — M1A9XX0 Chronic gout, unspecified, without tophus (tophi): Secondary | ICD-10-CM | POA: Diagnosis not present

## 2021-04-23 ENCOUNTER — Other Ambulatory Visit: Payer: Self-pay | Admitting: Surgery

## 2021-04-23 DIAGNOSIS — M1711 Unilateral primary osteoarthritis, right knee: Secondary | ICD-10-CM | POA: Diagnosis not present

## 2021-04-26 DIAGNOSIS — M1A00X1 Idiopathic chronic gout, unspecified site, with tophus (tophi): Secondary | ICD-10-CM | POA: Diagnosis not present

## 2021-04-27 ENCOUNTER — Other Ambulatory Visit: Payer: Self-pay | Admitting: Surgery

## 2021-04-29 ENCOUNTER — Ambulatory Visit
Admission: RE | Admit: 2021-04-29 | Discharge: 2021-04-29 | Disposition: A | Discharge status: Home / Self Care | Payer: PPO | Source: Ambulatory Visit | Attending: Surgery | Admitting: Surgery

## 2021-04-29 DIAGNOSIS — M25461 Effusion, right knee: Secondary | ICD-10-CM | POA: Diagnosis not present

## 2021-04-29 DIAGNOSIS — M7121 Synovial cyst of popliteal space [Baker], right knee: Secondary | ICD-10-CM | POA: Diagnosis not present

## 2021-04-29 DIAGNOSIS — M1711 Unilateral primary osteoarthritis, right knee: Secondary | ICD-10-CM | POA: Insufficient documentation

## 2021-04-29 DIAGNOSIS — S83231A Complex tear of medial meniscus, current injury, right knee, initial encounter: Secondary | ICD-10-CM | POA: Diagnosis not present

## 2021-04-30 ENCOUNTER — Other Ambulatory Visit: Payer: Self-pay | Admitting: Surgery

## 2021-05-01 ENCOUNTER — Encounter
Admission: RE | Admit: 2021-05-01 | Discharge: 2021-05-01 | Disposition: A | Discharge status: Home / Self Care | Payer: PPO | Source: Ambulatory Visit | Attending: Surgery | Admitting: Surgery

## 2021-05-01 ENCOUNTER — Encounter: Payer: Self-pay | Admitting: Surgery

## 2021-05-01 ENCOUNTER — Other Ambulatory Visit: Payer: Self-pay

## 2021-05-01 VITALS — BP 145/85 | HR 71 | Temp 97.7°F | Resp 18 | Ht 68.0 in | Wt 182.2 lb

## 2021-05-01 DIAGNOSIS — Z01818 Encounter for other preprocedural examination: Secondary | ICD-10-CM | POA: Diagnosis not present

## 2021-05-01 LAB — CBC WITH DIFFERENTIAL/PLATELET
Abs Immature Granulocytes: 0.18 10*3/uL — ABNORMAL HIGH (ref 0.00–0.07)
Basophils Absolute: 0 10*3/uL (ref 0.0–0.1)
Basophils Relative: 0 %
Eosinophils Absolute: 0 10*3/uL (ref 0.0–0.5)
Eosinophils Relative: 0 %
HCT: 38.4 % — ABNORMAL LOW (ref 39.0–52.0)
Hemoglobin: 12.4 g/dL — ABNORMAL LOW (ref 13.0–17.0)
Immature Granulocytes: 2 %
Lymphocytes Relative: 17 %
Lymphs Abs: 1.9 10*3/uL (ref 0.7–4.0)
MCH: 29.8 pg (ref 26.0–34.0)
MCHC: 32.3 g/dL (ref 30.0–36.0)
MCV: 92.3 fL (ref 80.0–100.0)
Monocytes Absolute: 0.9 10*3/uL (ref 0.1–1.0)
Monocytes Relative: 8 %
Neutro Abs: 8.3 10*3/uL — ABNORMAL HIGH (ref 1.7–7.7)
Neutrophils Relative %: 73 %
Platelets: 285 10*3/uL (ref 150–400)
RBC: 4.16 MIL/uL — ABNORMAL LOW (ref 4.22–5.81)
RDW: 14.2 % (ref 11.5–15.5)
WBC: 11.3 10*3/uL — ABNORMAL HIGH (ref 4.0–10.5)
nRBC: 0 % (ref 0.0–0.2)

## 2021-05-01 LAB — COMPREHENSIVE METABOLIC PANEL
ALT: 10 U/L (ref 0–44)
AST: 14 U/L — ABNORMAL LOW (ref 15–41)
Albumin: 3.9 g/dL (ref 3.5–5.0)
Alkaline Phosphatase: 54 U/L (ref 38–126)
Anion gap: 10 (ref 5–15)
BUN: 21 mg/dL (ref 8–23)
CO2: 26 mmol/L (ref 22–32)
Calcium: 9.3 mg/dL (ref 8.9–10.3)
Chloride: 104 mmol/L (ref 98–111)
Creatinine, Ser: 1.08 mg/dL (ref 0.61–1.24)
GFR, Estimated: >60 mL/min (ref >60)
Glucose, Bld: 78 mg/dL (ref 70–99)
Potassium: 3.8 mmol/L (ref 3.5–5.1)
Sodium: 140 mmol/L (ref 135–145)
Total Bilirubin: 0.6 mg/dL (ref 0.3–1.2)
Total Protein: 7 g/dL (ref 6.5–8.1)

## 2021-05-01 LAB — TYPE AND SCREEN
ABO/RH(D): O POS
Antibody Screen: NEGATIVE

## 2021-05-01 LAB — URINALYSIS, ROUTINE W REFLEX MICROSCOPIC
Bilirubin Urine: NEGATIVE
Glucose, UA: NEGATIVE mg/dL
Hgb urine dipstick: NEGATIVE
Ketones, ur: NEGATIVE mg/dL
Leukocytes,Ua: NEGATIVE
Nitrite: NEGATIVE
Protein, ur: NEGATIVE mg/dL
Specific Gravity, Urine: 1.009 (ref 1.005–1.030)
pH: 5 (ref 5.0–8.0)

## 2021-05-01 LAB — SURGICAL PCR SCREEN
MRSA, PCR: NEGATIVE
Staphylococcus aureus: NEGATIVE

## 2021-05-01 NOTE — Patient Instructions (Addendum)
Your procedure is scheduled on: Tuesday 05/08/21 ?Report to the Registration Desk on the 1st floor of the [facility]. ?To find out your arrival time, please call [phone] between 1PM - 3PM on: Monday 05/07/21 ? ?REMEMBER: ?Instructions that are not followed completely may result in serious medical risk, up to and including death; or upon the discretion of your surgeon and anesthesiologist your surgery may need to be rescheduled. ? ?Do not eat food after midnight the night before surgery.  ?No gum chewing, lozengers or hard candies. ? ?You may however, drink CLEAR liquids up to 2 hours before you are scheduled to arrive for your surgery. Do not drink anything within 2 hours of your scheduled arrival time. ? ?Clear liquids include: ?- water  ?- apple juice without pulp ?- gatorade (not RED colors) ?- black coffee or tea (Do NOT add milk or creamers to the coffee or tea) ?Do NOT drink anything that is not on this list. ? ?In addition, your doctor has ordered for you to drink the provided  ?Ensure Pre-Surgery Clear Carbohydrate Drink  ?Drinking this carbohydrate drink up to two hours before surgery helps to reduce insulin resistance and improve patient outcomes. Please complete drinking 2 hours prior to scheduled arrival time. ? ?TAKE THESE MEDICATIONS THE MORNING OF SURGERY WITH A SIP OF WATER: ?NONE ? ?One week prior to surgery: ?Stop Anti-inflammatories (NSAIDS) such as Advil, Aleve, Ibuprofen, Motrin, Naproxen, Naprosyn and Aspirin based products such as Excedrin, Goodys Powder, BC Powder. ? ?Stop taking your ASTAXANTHIN PO, CHERRY PO, Cholecalciferol (VITAMIN D3) 1000 units CAPS, Flaxseed, Linseed, (FLAX SEED OIL PO), Garlic 389 MG TABS, Milk Thistle-Dand-Fennel-Licor (MILK THISTLE XTRA) CAPS, Misc Natural Products (SAW PALMETTO) CAPS, and ANY OVER THE COUNTER supplements until after surgery. ? ?You may however, continue to take Tylenol if needed for pain up until the day of surgery. ? ?No Alcohol for 24 hours  before or after surgery. ? ?No Smoking including e-cigarettes for 24 hours prior to surgery.  ?No chewable tobacco products for at least 6 hours prior to surgery.  ?No nicotine patches on the day of surgery. ? ?Do not use any "recreational" drugs for at least a week prior to your surgery.  ?Please be advised that the combination of cocaine and anesthesia may have negative outcomes, up to and including death. ?If you test positive for cocaine, your surgery will be cancelled. ? ?On the morning of surgery brush your teeth with toothpaste and water, you may rinse your mouth with mouthwash if you wish. ?Do not swallow any toothpaste or mouthwash. ? ?Use CHG Soap as directed on instruction sheet. ? ?Do not wear jewelry. ? ?Do not wear lotions, powders, or colognes.  ? ?Do not shave body from the neck down 48 hours prior to surgery just in case you cut yourself which could leave a site for infection.  ?Also, freshly shaved skin may become irritated if using the CHG soap. ? ?dentures may not be worn into surgery. ? ?Do not bring valuables to the hospital. [facility] is not responsible for any missing/lost belongings or valuables.  ? ?Notify your doctor if there is any change in your medical condition (cold, fever, infection). ? ?Wear comfortable clothing (specific to your surgery type) to the hospital. ? ?After surgery, you can help prevent lung complications by doing breathing exercises.  ?Take deep breaths and cough every 1-2 hours. Your doctor may order a device called an Incentive Spirometer to help you take deep breaths. ? ?If  you are being admitted to the hospital overnight, leave your suitcase in the car. ?After surgery it may be brought to your room. ? ?If you are being discharged the day of surgery, you will not be allowed to drive home. ?You will need a responsible adult (18 years or older) to drive you home and stay with you that night.  ? ?If you are taking public transportation, you will need to have a  responsible adult (18 years or older) with you. ?Please confirm with your physician that it is acceptable to use public transportation.  ? ?Please call the [facility] at [phone] if you have any questions about these instructions. ? ?Surgery Visitation Policy: ? ?Patients undergoing a surgery or procedure may have two family members or support persons with them as long as the person is not COVID-19 positive or experiencing its symptoms.  ? ?Inpatient Visitation:   ? ?Visiting hours are 7 a.m. to 8 p.m. ?Up to four visitors are allowed at one time in a patient room, including children. The visitors may rotate out with other people during the day. One designated support person (adult) may remain overnight.  ?

## 2021-05-01 NOTE — Progress Notes (Signed)
?  Perioperative Services ?Pre-Admission/Anesthesia Testing ? ?  ?Date: 05/01/21 ? ?Name: Edward Baird ?MRN:   [id number] ? ?Re: History of familial MALIGNANT HYPERTHERMIA ?  ?Planned Surgical Procedure(s):  ?  ? Case: [id number] Date/Time: 05/08/21 0715  ? Procedure: UNICOMPARTMENTAL KNEE (Right: Knee)  ? Anesthesia type: Choice  ? Pre-op diagnosis: Primary osteoarthritis of right knee  ? Location: [facility] OR ROOM 03 / [facility] ANEST[facility]  ? Surgeons: Corky Mull, MD  ? ?Clinical Notes:  ?Patient scheduled for the above procedure on 05/08/2021 with Dr. Milagros Evener, MD. During his PAT interview, it was noted that patient has a (+) familial history of MALIGNANT HYPERTHERMIA in a third degree relative. Patient advising that his 1st cousin (paternal) experienced the anesthetic complication here at [facility] in 1982, which subsequently resulting in death.  ? ?In efforts to ensure procedural safety during anesthesia administration, this information was added to patient's medical history for review by the patient's surgical/anesthesia team.  Additionally, OR administrative staff [facility], RN) was made aware so that any appropriate/necessary precautions can be taken prior to the patient arriving to campus for his  planned surgical procedure. ?  ?Honor Loh, MSN, APRN, FNP-C, CEN ?[name]  ?Peri-operative Services Nurse Practitioner ?Phone: [phone] ?05/01/21 1:57 PM ? ?NOTE: This note has been prepared using [employer]. Despite my best ability to proofread, there is always the potential that unintentional transcriptional errors may still occur from this process. ?

## 2021-05-08 ENCOUNTER — Other Ambulatory Visit: Payer: Self-pay

## 2021-05-08 ENCOUNTER — Ambulatory Visit
Admission: RE | Admit: 2021-05-08 | Discharge: 2021-05-08 | Disposition: A | Discharge status: Home / Self Care | Payer: PPO | Attending: Surgery | Admitting: Surgery

## 2021-05-08 ENCOUNTER — Ambulatory Visit: Payer: PPO

## 2021-05-08 ENCOUNTER — Ambulatory Visit: Payer: PPO | Admitting: Urgent Care

## 2021-05-08 ENCOUNTER — Encounter: Payer: Self-pay | Admitting: Surgery

## 2021-05-08 ENCOUNTER — Encounter
Admission: RE | Disposition: A | Discharge status: Home / Self Care | Payer: Self-pay | Source: Home / Self Care | Attending: Surgery

## 2021-05-08 DIAGNOSIS — M1711 Unilateral primary osteoarthritis, right knee: Secondary | ICD-10-CM | POA: Insufficient documentation

## 2021-05-08 DIAGNOSIS — R2689 Other abnormalities of gait and mobility: Secondary | ICD-10-CM | POA: Diagnosis not present

## 2021-05-08 DIAGNOSIS — Z87891 Personal history of nicotine dependence: Secondary | ICD-10-CM | POA: Insufficient documentation

## 2021-05-08 DIAGNOSIS — Z9049 Acquired absence of other specified parts of digestive tract: Secondary | ICD-10-CM | POA: Diagnosis not present

## 2021-05-08 DIAGNOSIS — Z471 Aftercare following joint replacement surgery: Secondary | ICD-10-CM | POA: Diagnosis not present

## 2021-05-08 DIAGNOSIS — M6281 Muscle weakness (generalized): Secondary | ICD-10-CM | POA: Diagnosis not present

## 2021-05-08 DIAGNOSIS — E785 Hyperlipidemia, unspecified: Secondary | ICD-10-CM | POA: Diagnosis not present

## 2021-05-08 DIAGNOSIS — Z85038 Personal history of other malignant neoplasm of large intestine: Secondary | ICD-10-CM | POA: Insufficient documentation

## 2021-05-08 DIAGNOSIS — I10 Essential (primary) hypertension: Secondary | ICD-10-CM | POA: Diagnosis not present

## 2021-05-08 DIAGNOSIS — Z96651 Presence of right artificial knee joint: Secondary | ICD-10-CM | POA: Diagnosis not present

## 2021-05-08 HISTORY — DX: Family history of other specified conditions: Z84.89

## 2021-05-08 HISTORY — PX: PARTIAL KNEE ARTHROPLASTY: SHX2174

## 2021-05-08 HISTORY — DX: Malignant neoplasm of colon, unspecified: C18.9

## 2021-05-08 LAB — ABO/RH: ABO/RH(D): O POS

## 2021-05-08 SURGERY — ARTHROPLASTY, KNEE, UNICOMPARTMENTAL
Anesthesia: Spinal | Site: Knee | Laterality: Right

## 2021-05-08 MED ORDER — PROPOFOL 1000 MG/100ML IV EMUL
INTRAVENOUS | Status: AC
  Filled 2021-05-08: qty 100

## 2021-05-08 MED ORDER — KETOROLAC TROMETHAMINE 15 MG/ML IJ SOLN: 7.5000 mg | Freq: Four times a day (QID) | INTRAMUSCULAR | Status: DC

## 2021-05-08 MED ORDER — SODIUM CHLORIDE 0.9 % BOLUS PEDS
250.0000 mL | Freq: Once | INTRAVENOUS | Status: AC
  Administered 2021-05-08: 250 mL via INTRAVENOUS

## 2021-05-08 MED ORDER — EPHEDRINE SULFATE (PRESSORS) 50 MG/ML IJ SOLN
INTRAMUSCULAR | Status: DC | PRN
  Administered 2021-05-08: 5 mg via INTRAVENOUS
  Administered 2021-05-08: 10 mg via INTRAVENOUS

## 2021-05-08 MED ORDER — BUPIVACAINE LIPOSOME 1.3 % IJ SUSP
INTRAMUSCULAR | Status: DC | PRN
  Administered 2021-05-08: 20 mL

## 2021-05-08 MED ORDER — SODIUM CHLORIDE 0.9 % IV SOLN: INTRAVENOUS | Status: DC

## 2021-05-08 MED ORDER — BUPIVACAINE LIPOSOME 1.3 % IJ SUSP
INTRAMUSCULAR | Status: AC
Start: 2021-05-08 — End: ?
  Filled 2021-05-08: qty 20

## 2021-05-08 MED ORDER — BUPIVACAINE HCL (PF) 0.5 % IJ SOLN
INTRAMUSCULAR | Status: DC | PRN
Start: 2021-05-08 — End: 2021-05-08
  Administered 2021-05-08: 2.6 mL via INTRATHECAL

## 2021-05-08 MED ORDER — ORAL CARE MOUTH RINSE: 15.0000 mL | Freq: Once | OROMUCOSAL | Status: AC

## 2021-05-08 MED ORDER — CEFAZOLIN SODIUM-DEXTROSE 2-4 GM/100ML-% IV SOLN
2.0000 g | Freq: Four times a day (QID) | INTRAVENOUS | Status: DC
  Administered 2021-05-08: 2 g via INTRAVENOUS

## 2021-05-08 MED ORDER — KETOROLAC TROMETHAMINE 15 MG/ML IJ SOLN
15.0000 mg | Freq: Once | INTRAMUSCULAR | Status: AC
  Administered 2021-05-08: 15 mg via INTRAVENOUS

## 2021-05-08 MED ORDER — PHENYLEPHRINE HCL-NACL 20-0.9 MG/250ML-% IV SOLN
INTRAVENOUS | Status: DC | PRN
  Administered 2021-05-08: 25 ug/min via INTRAVENOUS

## 2021-05-08 MED ORDER — SODIUM CHLORIDE FLUSH 0.9 % IV SOLN
INTRAVENOUS | Status: AC
  Filled 2021-05-08: qty 40

## 2021-05-08 MED ORDER — CHLORHEXIDINE GLUCONATE 0.12 % MT SOLN
OROMUCOSAL | Status: AC
  Administered 2021-05-08: 15 mL via OROMUCOSAL
  Filled 2021-05-08: qty 15

## 2021-05-08 MED ORDER — ONDANSETRON HCL 4 MG/2ML IJ SOLN: 4.0000 mg | Freq: Four times a day (QID) | INTRAMUSCULAR | Status: DC | PRN

## 2021-05-08 MED ORDER — FAMOTIDINE 20 MG PO TABS
20.0000 mg | ORAL_TABLET | Freq: Once | ORAL | Status: AC
Start: 2021-05-08 — End: 2021-05-08

## 2021-05-08 MED ORDER — TRANEXAMIC ACID 1000 MG/10ML IV SOLN
INTRAVENOUS | Status: AC
  Filled 2021-05-08: qty 10

## 2021-05-08 MED ORDER — PHENYLEPHRINE HCL (PRESSORS) 10 MG/ML IV SOLN
INTRAVENOUS | Status: AC
  Filled 2021-05-08: qty 1

## 2021-05-08 MED ORDER — CEFAZOLIN SODIUM-DEXTROSE 2-4 GM/100ML-% IV SOLN
INTRAVENOUS | Status: AC
  Filled 2021-05-08: qty 100

## 2021-05-08 MED ORDER — FAMOTIDINE 20 MG PO TABS
ORAL_TABLET | ORAL | Status: AC
  Administered 2021-05-08: 20 mg via ORAL
  Filled 2021-05-08: qty 1

## 2021-05-08 MED ORDER — PROPOFOL 500 MG/50ML IV EMUL
INTRAVENOUS | Status: DC | PRN
  Administered 2021-05-08: 60 ug/kg/min via INTRAVENOUS

## 2021-05-08 MED ORDER — ACETAMINOPHEN 500 MG PO TABS: 1000.0000 mg | ORAL_TABLET | Freq: Four times a day (QID) | ORAL | Status: DC

## 2021-05-08 MED ORDER — ONDANSETRON HCL 4 MG PO TABS
4.0000 mg | ORAL_TABLET | Freq: Four times a day (QID) | ORAL | Status: DC | PRN
Start: 2021-05-08 — End: 2021-05-08

## 2021-05-08 MED ORDER — ACETAMINOPHEN 10 MG/ML IV SOLN
INTRAVENOUS | Status: DC | PRN
  Administered 2021-05-08: 1000 mg via INTRAVENOUS

## 2021-05-08 MED ORDER — FENTANYL CITRATE (PF) 100 MCG/2ML IJ SOLN
INTRAMUSCULAR | Status: DC | PRN
  Administered 2021-05-08 (×2): 50 ug via INTRAVENOUS

## 2021-05-08 MED ORDER — BUPIVACAINE-EPINEPHRINE (PF) 0.5% -1:200000 IJ SOLN
INTRAMUSCULAR | Status: DC | PRN
  Administered 2021-05-08: 30 mL via PERINEURAL

## 2021-05-08 MED ORDER — METOCLOPRAMIDE HCL 10 MG PO TABS: 5.0000 mg | ORAL_TABLET | Freq: Three times a day (TID) | ORAL | Status: DC | PRN

## 2021-05-08 MED ORDER — APIXABAN 2.5 MG PO TABS
2.5000 mg | ORAL_TABLET | Freq: Two times a day (BID) | ORAL | 0 refills | Status: DC
Start: 2021-05-09 — End: 2022-12-04

## 2021-05-08 MED ORDER — SODIUM CHLORIDE 0.9 % IR SOLN
Status: DC | PRN
Start: 2021-05-08 — End: 2021-05-08
  Administered 2021-05-08: 1500 mL
  Administered 2021-05-08: 500 mL

## 2021-05-08 MED ORDER — CEFAZOLIN SODIUM-DEXTROSE 2-4 GM/100ML-% IV SOLN
2.0000 g | INTRAVENOUS | Status: AC
Start: 2021-05-08 — End: 2021-05-08
  Administered 2021-05-08: 2 g via INTRAVENOUS

## 2021-05-08 MED ORDER — CHLORHEXIDINE GLUCONATE 0.12 % MT SOLN: 15.0000 mL | Freq: Once | OROMUCOSAL | Status: AC

## 2021-05-08 MED ORDER — OXYCODONE HCL 5 MG PO TABS: 5.0000 mg | ORAL_TABLET | ORAL | 0 refills | Status: DC | PRN

## 2021-05-08 MED ORDER — GLYCOPYRROLATE 0.2 MG/ML IJ SOLN
INTRAMUSCULAR | Status: DC | PRN
  Administered 2021-05-08: .2 mg via INTRAVENOUS

## 2021-05-08 MED ORDER — OXYCODONE HCL 5 MG PO TABS: 5.0000 mg | ORAL_TABLET | ORAL | Status: DC | PRN

## 2021-05-08 MED ORDER — TRANEXAMIC ACID 1000 MG/10ML IV SOLN
INTRAVENOUS | Status: DC | PRN
  Administered 2021-05-08: 1000 mg via TOPICAL

## 2021-05-08 MED ORDER — METOCLOPRAMIDE HCL 5 MG/ML IJ SOLN: 5.0000 mg | Freq: Three times a day (TID) | INTRAMUSCULAR | Status: DC | PRN

## 2021-05-08 MED ORDER — LACTATED RINGERS IV SOLN: INTRAVENOUS | Status: DC

## 2021-05-08 MED ORDER — BUPIVACAINE-EPINEPHRINE (PF) 0.5% -1:200000 IJ SOLN
INTRAMUSCULAR | Status: AC
  Filled 2021-05-08: qty 30

## 2021-05-08 MED ORDER — KETOROLAC TROMETHAMINE 15 MG/ML IJ SOLN
INTRAMUSCULAR | Status: AC
  Filled 2021-05-08: qty 1

## 2021-05-08 MED ORDER — ACETAMINOPHEN 10 MG/ML IV SOLN
INTRAVENOUS | Status: AC
  Filled 2021-05-08: qty 100

## 2021-05-08 MED ORDER — FENTANYL CITRATE (PF) 100 MCG/2ML IJ SOLN
INTRAMUSCULAR | Status: AC
Start: 2021-05-08 — End: ?
  Filled 2021-05-08: qty 2

## 2021-05-08 MED ORDER — FENTANYL CITRATE (PF) 100 MCG/2ML IJ SOLN: 25.0000 ug | INTRAMUSCULAR | Status: DC | PRN

## 2021-05-08 SURGICAL SUPPLY — 67 items
APL PRP STRL LF DISP 70% ISPRP (MISCELLANEOUS) ×2
BEARING MENISCAL TIBIAL 4 MD R (Orthopedic Implant) ×1 IMPLANT
BIT DRILL QUICK REL 1/8 2PK SL (DRILL) IMPLANT
BNDG ELASTIC 6X5.8 VLCR STR LF (GAUZE/BANDAGES/DRESSINGS) ×3 IMPLANT
BRNG TIB MED 4 PHS 3 RT MEN (Orthopedic Implant) ×1 IMPLANT
CEMENT BONE R 1X40 (Cement) ×3 IMPLANT
CEMENT VACUUM MIXING SYSTEM (MISCELLANEOUS) ×3 IMPLANT
CHLORAPREP W/TINT 26 (MISCELLANEOUS) ×6 IMPLANT
COOLER POLAR GLACIER W/PUMP (MISCELLANEOUS) ×3 IMPLANT
COVER MAYO STAND REUSABLE (DRAPES) ×3 IMPLANT
CUFF TOURN SGL QUICK 24 (TOURNIQUET CUFF)
CUFF TOURN SGL QUICK 34 (TOURNIQUET CUFF)
CUFF TRNQT CYL 24X4X16.5-23 (TOURNIQUET CUFF) IMPLANT
CUFF TRNQT CYL 34X4.125X (TOURNIQUET CUFF) IMPLANT
DRAPE C-ARM XRAY 36X54 (DRAPES) ×1 IMPLANT
DRAPE U-SHAPE 47X51 STRL (DRAPES) ×6 IMPLANT
DRILL QUICK RELEASE 1/8 INCH (DRILL) ×2
DRSG MEPILEX SACRM 8.7X9.8 (GAUZE/BANDAGES/DRESSINGS) ×3 IMPLANT
DRSG OPSITE POSTOP 4X12 (GAUZE/BANDAGES/DRESSINGS) ×2 IMPLANT
DRSG OPSITE POSTOP 4X6 (GAUZE/BANDAGES/DRESSINGS) ×2 IMPLANT
DRSG OPSITE POSTOP 4X8 (GAUZE/BANDAGES/DRESSINGS) ×1 IMPLANT
ELECT CAUTERY BLADE 6.4 (BLADE) ×3 IMPLANT
ELECT REM PT RETURN 9FT ADLT (ELECTROSURGICAL) ×2
ELECTRODE REM PT RTRN 9FT ADLT (ELECTROSURGICAL) ×2 IMPLANT
GAUZE SPONGE 4X4 12PLY STRL (GAUZE/BANDAGES/DRESSINGS) ×3 IMPLANT
GAUZE XEROFORM 1X8 LF (GAUZE/BANDAGES/DRESSINGS) ×3 IMPLANT
GLOVE BIO SURGEON STRL SZ7.5 (GLOVE) ×1 IMPLANT
GLOVE BIOGEL PI IND STRL 8 (GLOVE) ×2 IMPLANT
GLOVE BIOGEL PI INDICATOR 8 (GLOVE) ×1
GLOVE SURG ENC MOIS LTX SZ7.5 (GLOVE) ×12 IMPLANT
GLOVE SURG ENC MOIS LTX SZ8 (GLOVE) ×12 IMPLANT
GLOVE SURG UNDER LTX SZ8 (GLOVE) ×3 IMPLANT
GOWN STRL REUS W/ TWL LRG LVL3 (GOWN DISPOSABLE) ×2 IMPLANT
GOWN STRL REUS W/ TWL XL LVL3 (GOWN DISPOSABLE) ×2 IMPLANT
GOWN STRL REUS W/TWL LRG LVL3 (GOWN DISPOSABLE) ×2
GOWN STRL REUS W/TWL XL LVL3 (GOWN DISPOSABLE) ×2
HOOD PEEL AWAY FLYTE STAYCOOL (MISCELLANEOUS) ×10 IMPLANT
IV NS IRRIG 3000ML ARTHROMATIC (IV SOLUTION) ×3 IMPLANT
KIT TURNOVER KIT A (KITS) ×3 IMPLANT
KNEE UNICOMP MEDIAL OXFORD RT (Joint) ×1 IMPLANT
MANIFOLD NEPTUNE II (INSTRUMENTS) ×3 IMPLANT
MAT ABSORB  FLUID 56X50 GRAY (MISCELLANEOUS) ×2
MAT ABSORB FLUID 56X50 GRAY (MISCELLANEOUS) ×2 IMPLANT
NDL SAFETY ECLIPSE 18X1.5 (NEEDLE) ×2 IMPLANT
NDL SPNL 20GX3.5 QUINCKE YW (NEEDLE) ×2 IMPLANT
NEEDLE HYPO 18GX1.5 SHARP (NEEDLE) ×2
NEEDLE SPNL 20GX3.5 QUINCKE YW (NEEDLE) ×2 IMPLANT
NS IRRIG 1000ML POUR BTL (IV SOLUTION) ×3 IMPLANT
PACK BLADE SAW RECIP 70 3 PT (BLADE) ×1 IMPLANT
PACK TOTAL KNEE (MISCELLANEOUS) ×3 IMPLANT
PAD ABD DERMACEA PRESS 5X9 (GAUZE/BANDAGES/DRESSINGS) ×6 IMPLANT
PAD WRAPON POLAR KNEE (MISCELLANEOUS) ×2 IMPLANT
PEG TWIN FEM CEMENTED MED (Knees) ×1 IMPLANT
PULSAVAC PLUS IRRIG FAN TIP (DISPOSABLE) ×2
STAPLER SKIN PROX 35W (STAPLE) ×3 IMPLANT
STRAP SAFETY 5IN WIDE (MISCELLANEOUS) ×3 IMPLANT
SUCTION FRAZIER HANDLE 10FR (MISCELLANEOUS) ×2
SUCTION TUBE FRAZIER 10FR DISP (MISCELLANEOUS) ×2 IMPLANT
SUT VIC AB 0 CT1 36 (SUTURE) ×3 IMPLANT
SUT VIC AB 2-0 CT1 27 (SUTURE) ×8
SUT VIC AB 2-0 CT1 TAPERPNT 27 (SUTURE) ×8 IMPLANT
SYR 10ML LL (SYRINGE) ×3 IMPLANT
SYR 30ML LL (SYRINGE) ×7 IMPLANT
TAPE TRANSPORE STRL 2 31045 (GAUZE/BANDAGES/DRESSINGS) ×3 IMPLANT
TIP FAN IRRIG PULSAVAC PLUS (DISPOSABLE) ×2 IMPLANT
WATER STERILE IRR 500ML POUR (IV SOLUTION) ×3 IMPLANT
WRAPON POLAR PAD KNEE (MISCELLANEOUS) ×2

## 2021-05-08 NOTE — H&P (Signed)
History of Present Illness:  ?Edward Baird is a 81 y.o. male who presents for follow-up of his right knee pain secondary to degenerative joint disease. The patient was last seen for the symptoms 5 months ago. At this visit, he did not wish to consider surgical intervention, so a series of viscosupplementation injections were ordered and administered by Reche Dixon, PA-C, in late November and early December. The patient notes that the symptoms injections helped him for about a month before his symptoms began to recur. He again notes moderate pain in his knee which he rates at 5/10 on today's visit. He has been taking Aleve as necessary with limited benefit. His symptoms are worse with any prolonged standing or ambulation, as well as with ascending/descending stairs. He also has pain at night, and frequently interfering with his ability to sleep. He feels that his symptoms are continuing to worsen and causing him to limp. He denies any reinjury to the knee, and denies any numbness or paresthesias down his leg to his foot. He is quite frustrated by his symptoms and is ready to discuss more aggressive treatment options. ? ?Current Outpatient Medications: ? allopurinoL (ZYLOPRIM) 100 MG tablet Take 1 tablet (100 mg total) by mouth once daily 90 tablet 3  ? antiarthritic combination no.2 (GLUCOSAMINE-CHONDROITIN) 900 mg Tab Take by mouth  ? ascorbic acid, vitamin C, (VITAMIN C) 500 MG tablet Take 500 mg by mouth once daily  ? ASTAXANTHIN ORAL Take by mouth  ? BORON CITRATE, BULK, MISC Use  ? Boswellia serrata extract (BOSWELLIA SERRATA XT, BULK, MISC) Use  ? cherry flavor (CHERRY CONCENTRATE ORAL) Take by mouth  ? cholecalciferol (VITAMIN D3) 1000 unit tablet Take by mouth  ? colchicine (COLCRYS) 0.6 mg tablet Take 1 tablet (0.6 mg total) by mouth 2 (two) times daily as needed 15 tablet 0  ? garlic 161 mg Cap Take by mouth  ? lutein 20 mg Cap Take by mouth  ? mvi,min 9-FA-saw palmetto xt 1,000 mcg- 320 mg Tab Take by  mouth  ? tumeric-ging-olive-oreg-capryl 100 mg-150 mg- 50 mg-150 mg Cap Take by mouth  ? ?Allergies: ?No Known Allergies ? ?Past Medical History:  ? Arthritis  ? Cataract cortical, senile  ? Colon cancer (CMS-HCC)  ? Gout  ? Hyperlipidemia  ? Kidney stones  ? ?Past Surgical History:  ? COLON SURGERY  ? kidney stone  ? prostate biopsy  ? PROSTATE SURGERY  ? ?Family History:  ? Myocardial Infarction (Heart attack) Mother  ? Kidney disease Mother  ? Dementia Father  ? Bleeding Disorder Brother  ? ?Social History:  ? ?Socioeconomic History:  ? Marital status: Married  ?Tobacco Use  ? Smoking status: Never  ? Smokeless tobacco: Never  ?Vaping Use  ? Vaping Use: Never used  ?Substance and Sexual Activity  ? Alcohol use: Never  ? Drug use: Never  ? Sexual activity: Yes  ?Partners: Female  ? ?Review of Systems:  ?A comprehensive 14 point ROS was performed, reviewed, and the pertinent orthopaedic findings are documented in the HPI. ? ?Physical Exam: ?Vitals:  ?04/23/21 1203  ?BP: 132/80  ?Weight: 83.8 kg (184 lb 12.8 oz)  ?Height: 172.7 cm ('5\' 8"'$ )  ?PainSc: 5  ?PainLoc: Knee  ? ?General/Constitutional: The patient appears to be well-nourished, well-developed, and in no acute distress. ?Neuro/Psych: Normal mood and affect, oriented to person, place and time. ?Eyes: Non-icteric. Pupils are equal, round, and reactive to light, and exhibit synchronous movement. ?ENT: Unremarkable. ?Lymphatic: No palpable adenopathy. ?Respiratory: Lungs  clear to auscultation, Normal chest excursion, No wheezes and Non-labored breathing ?Cardiovascular: Regular rate and rhythm. No murmurs. and No edema, swelling or tenderness, except as noted in detailed exam. ?Integumentary: No impressive skin lesions present, except as noted in detailed exam. ?Musculoskeletal: Unremarkable, except as noted in detailed exam. ? ?Right knee exam: ?GAIT: Antalgic gait favoring his right leg, but not using any assistive devices. ?ALIGNMENT: Moderate varus ?SKIN:  Unremarkable ?SWELLING: Mild ?EFFUSION: Small ?WARMTH: None ?TENDERNESS: Moderate tenderness along medial joint line, but no lateral joint line tenderness and no peripatellar tenderness ?ROM: 5-120 degrees with pain in maximal flexion ?McMURRAY'S: Equivocally positive ?PATELLOFEMORAL: Normal tracking with no peri-patellar tenderness and negative apprehension sign ?CREPITUS: None ?LACHMAN'S: Negative ?PIVOT SHIFT: Negative ?ANTERIOR DRAWER: Negative ?POSTERIOR DRAWER: Negative ?VARUS/VALGUS: Positive pseudolaxity to varus stressing ?  ?He remains neurovascularly intact to the right lower extremity and foot. ? ?X-rays/MRI/Lab data:  ?Recent standing AP and lateral x-rays of the right knee, as well as a sunrise view, are available for review and have been reviewed by myself. These films demonstrate moderate degenerative changes, primarily above the medial compartment, with 90% loss of the medial compartment clear space. There also appears to be some irregularity along the cortical weightbearing portion of the medial femoral condyle. The lateral and patellofemoral compartments appear to be well-maintained. Overall alignment is moderate varus. No fractures, lytic lesions, or abnormal calcifications are noted. ? ?Assessment: ? Primary osteoarthritis of right knee.  ? ?Plan: ?The treatment options were discussed with the patient. In addition, patient educational materials were provided regarding the diagnosis and treatment options. The patient is quite frustrated by his symptoms and functional limitations, and is ready to consider more aggressive treatment options. Therefore, I have recommended a surgical procedure, specifically a partial knee replacement. The procedure was discussed with the patient, as were the potential risks (including bleeding, infection, nerve and/or blood vessel injury, persistent or recurrent pain, loosening and/or failure of the components, dislocation, need for further surgery, blood clots,  strokes, heart attacks and/or arhythmias, pneumonia, etc.) and benefits. The patient states his/her understanding and wishes to proceed. All of the patient's questions and concerns were answered. He can call any time with further concerns. He will follow up post-surgery, routine. ? ? ?H&P reviewed and patient re-examined. No changes. ? ?

## 2021-05-08 NOTE — Anesthesia Preprocedure Evaluation (Signed)
Anesthesia Evaluation  ?Patient identified by MRN, date of birth, ID band ?Patient awake ? ? ? ?Reviewed: ?Allergy & Precautions, H&P , NPO status , Patient's Chart, lab work & pertinent test results, reviewed documented beta blocker date and time  ? ?History of Anesthesia Complications ?(+) Family history of anesthesia reaction and history of anesthetic complications (FH of MH in 1st cousin) ? ?Airway ?Mallampati: I ? ?TM Distance: >3 FB ?Neck ROM: full ? ? ? Dental ? ?(+) Partial Upper ?  ?Pulmonary ?neg pulmonary ROS, former smoker,  ?  ?Pulmonary exam normal ?breath sounds clear to auscultation ? ? ? ? ? ? Cardiovascular ?Exercise Tolerance: Good ?hypertension, (-) angina(-) Past MI and (-) Cardiac Stents Normal cardiovascular exam(-) dysrhythmias (-) Valvular Problems/Murmurs ?Rhythm:regular Rate:Normal ? ? ?  ?Neuro/Psych ?negative neurological ROS ? negative psych ROS  ? GI/Hepatic ?negative GI ROS, Neg liver ROS,   ?Endo/Other  ?negative endocrine ROS ? Renal/GU ?CRFRenal disease  ?negative genitourinary ?  ?Musculoskeletal ? ? Abdominal ?  ?Peds ? Hematology ?negative hematology ROS ?(+)   ?Anesthesia Other Findings ?Past Medical History: ?No date: Colon cancer [facility]) ?    Comment:  a.) s/p partial colectomy ?No date: Family history of malignant hyperthermia ?    Comment:  a.) paternal 1st cousin (3rd degree relative) with (+)  ?             MH; died intraoperatively here at [facility] in 1982. ?No date: Gout ?No date: Hyperlipidemia ?No date: Hypertension ? ? Reproductive/Obstetrics ?negative OB ROS ? ?  ? ? ? ? ? ? ? ? ? ? ? ? ? ?  ?  ? ? ? ? ? ? ? ? ?Anesthesia Physical ?Anesthesia Plan ? ?ASA: 2 ? ?Anesthesia Plan: Spinal  ? ?Post-op Pain Management:   ? ?Induction:  ? ?PONV Risk Score and Plan: 1 and TIVA and Propofol infusion ? ?Airway Management Planned:  ? ?Additional Equipment:  ? ?Intra-op Plan:  ? ?Post-operative Plan:  ? ?Informed Consent: I have reviewed the patients  History and Physical, chart, labs and discussed the procedure including the risks, benefits and alternatives for the proposed anesthesia with the patient or authorized representative who has indicated his/her understanding and acceptance.  ? ? ? ?Dental Advisory Given ? ?Plan Discussed with: Anesthesiologist, CRNA and Surgeon ? ?Anesthesia Plan Comments:   ? ? ? ? ? ? ?Anesthesia Quick Evaluation ? ?

## 2021-05-08 NOTE — Op Note (Signed)
05/08/2021 ? ?10:03 AM ? ?Patient:   Edward Baird ? ?Pre-Op Diagnosis:   Osteoarthritis of medial compartment, right knee. ? ?Post-Op Diagnosis:   Same ? ?Procedure:   Right unicondylar knee arthroplasty. ? ?Surgeon:   Pascal Lux, MD ? ?Assistant:   Cameron Proud, PA-C; Myra Rude, PA-S ? ?Anesthesia:   Spinal ? ?Findings:   As above. ? ?Complications:   None ? ?EBL:   5 cc ? ?Fluids:   800 cc crystalloid ? ?UOP:   None ? ?TT:   90 minutes at 300 mmHg ? ?Drains:   None ? ?Closure:   Staples ? ?Implants:   All-cemented Biomet Oxford system with a medium femoral component, a "D" sized tibial tray, and a 4 mm meniscal bearing insert. ? ?Brief Clinical Note:   The patient is a 81 year old male with a history of progressively worsening medial sided right knee pain. His symptoms have progressed despite medications, activity modification, etc. His history and examination are consistent with degenerative joint disease, primarily involving the medial compartment. His preoperative MRI scan also demonstrated a complex degenerative tear of the medial meniscus with relative sparing of the lateral and patellofemoral compartments. The patient presents at this time for a right partial knee replacement. ? ?Procedure:   The patient was brought into the operating room.  After a spinal was placed by the anesthesiologist, the patient was lain in the supine position and repositioned so that the non-surgical leg was placed in a flexed and abducted position in the yellow fin leg holder while the surgical extremity was placed over the Biomet leg holder. The right lower extremity was prepped with ChloraPrep solution before being draped sterilely. Preoperative antibiotics were administered. After performing a timeout to verify the appropriate surgical site, the limb was exsanguinated with an Esmarch and the tourniquet inflated to 300 mmHg.  ? ?A standard anterior approach to the knee was made through an approximately 3.5-4 inch  incision. The incision was carried down through the subcutaneous tissues to expose the superficial retinaculum. This was split the length the incision and the medial flap elevated sufficiently to expose the medial retinaculum. The medial retinaculum was incised along the medial border of the patella tendon and extended proximally along the medial border of the patella, leaving a 3-4 mm cuff of tissue. The soft tissues were elevated off the anteromedial aspect of the proximal tibia. The anterior portion of the meniscus was removed after performing a subtotal excision of the infrapatellar fat pad. The anterior cruciate ligament was inspected and found to be in excellent condition. Osteophytes were removed from the inferior pole of the patella as well as from the notch using a quarter-inch osteotome. There were significant degenerative changes of both the femur and tibia on the medial side.  ? ?The medial femoral condyle was sized using the small and medium sizers. It was felt that the medium guide best optimized the contour of the femur. This was left in place and the external tibial guide positioned. The coupling device was used to connect the guide to the medial femoral condylar sizer to optimize appropriate orientation. Two guide pins were inserted into the cutting block before the coupling device and sizer were removed. The appropriate tibial cut was made using the oscillating and reciprocating saws. The piece was removed in its entirety and taken to the back table where it was sized and found to be optimally replicated by a "D" sized component. The 9 mm spacer was inserted to verify that sufficient  bone had been removed. ? ?Attention was directed to femoral side. The intramedullary canal was accessed through a 4 mm drill hole. The intramedullary guide was positioned before the guide for the femoral condylar holes was positioned. The appropriate coupling device connected this guide to the intramedullary guide  before both drill holes were created in the distal aspect of the medial femoral condyle. The devices were removed and the posterior condylar cutting block inserted. The appropriate cut was made using the reciprocating saw and this piece removed. The #0 spigot was inserted and the initial bone milling performed. A trial femoral component was inserted and both the flexion and extension gaps measured. In flexion, the gap measured 9 mm whereas in extension, it measured 7 mm. Therefore, the #2 spigot was selected and the secondary bone milling performed. Repeat sizing demonstrated symmetric flexion and extension gaps. The bone was removed from the postero-medial and postero-lateral aspects of the femoral condyle, as well as from the beneath the collar of the spigot. Bone also was removed from the anterior portion of the femur so as to minimize any potential impingement with the meniscal bearing insert. The trial components removed and several drill holes placed into the distal femoral condyle to further augment cement fixation. ? ?Attention was redirected to the tibial side. The "D" sized tibial tray was positioned and temporarily secured using the appropriate spiked nail. The keel was created using the bi-bladed reciprocating saw and hoe. The keeled "D" sized trial tibial tray was inserted to be sure that it seated properly. At this point, a total of 20 cc of Exparel diluted out to 60 cc with normal saline and 30 cc of 0.5% Sensorcaine was injected in and around the posterior and medial capsular tissues, as well as the peri-incisional tissues to help with postoperative pain control. ? ?The bony surfaces were prepared for cementing by irrigating them thoroughly with bacitracin saline solution using the jet lavage system before packing them with a dry Ray-Tec sponge. Meanwhile, cement was being mixed on the back table. When the cement was ready, the tibial tray was cemented in first. The excess cement was removed using a  Surveyor, quantity after impacting it into place. Next, the femoral component was impacted into place. Again the excess cement was removed using a Surveyor, quantity. The 5 mm spacer was inserted and the knee brought into near full extension while the cement hardened. Once the cement hardened, the spacer was removed and the 4 mm meniscal bearing insert was trialed. This demonstrated excellent tracking while the knee was placed through a range of motion, and showed no evidence towards subluxation or dislocation. In addition, it did not fit too tightly. Therefore, the permanent 4 mm meniscal bearing insert was snapped into position after verifying that no cement had been retained posteriorly. Again the knee was placed through a range of motion with the findings as described above. ? ?The wound was copiously irrigated with sterile saline solution via the jet lavage system before the retinacular layer was reapproximated using #0 Vicryl interrupted sutures. At this point, 1 g of transexemic acid in 10 cc of normal saline was injected intra-articularly. The subcutaneous tissues were closed in two layers using 2-0 Vicryl interrupted sutures before the skin was closed using staples. A sterile occlusive dressing was applied to the knee before the patient was awakened. The patient was transferred back to his/her hospital bed and returned to the recovery room in satisfactory condition after tolerating the procedure well. A Polar Care device  was applied to the knee as well. ?

## 2021-05-08 NOTE — Transfer of Care (Signed)
Immediate Anesthesia Transfer of Care Note ? ?Patient: Edward Baird ? ?Procedure(s) Performed: UNICOMPARTMENTAL KNEE (Right: Knee) ? ?Patient Location: PACU ? ?Anesthesia Type:Spinal ? ?Level of Consciousness: drowsy ? ?Airway & Oxygen Therapy: Patient Spontanous Breathing and Patient connected to face mask oxygen ? ?Post-op Assessment: Report given to RN ? ?Post vital signs: stable ? ?Last Vitals:  ?Vitals Value Taken Time  ?BP    ?Temp    ?Pulse 68 05/08/21 1016  ?Resp 14 05/08/21 1016  ?SpO2 100 % 05/08/21 1016  ?Vitals shown include unvalidated device data. ? ?Last Pain:  ?Vitals:  ? 05/08/21 0631  ?TempSrc: Temporal  ?PainSc: 0-No pain  ?   ? ?  ? ?Complications: No notable events documented. ?

## 2021-05-08 NOTE — Discharge Instructions (Addendum)
Orthopedic discharge instructions: ?May shower with intact OpSite dressing. ?Apply ice frequently to knee or use Polar Care device. ?Start Eliquis 2.5 mg twice daily for 2 weeks beginning Wednesday, 05/09/2021, then take aspirin 325 mg twice daily for 4 weeks. ?Take pain medication as prescribed or ES Tylenol when needed.  ?May weight-bear as tolerated - use walker for balance and support. ?Follow-up in 10-14 days or as scheduled. ? ?AMBULATORY SURGERY  ?DISCHARGE INSTRUCTIONS ? ? ?The drugs that you were given will stay in your system until tomorrow so for the next 24 hours you should not: ? ?Drive an automobile ?Make any legal decisions ?Drink any alcoholic beverage ? ? ?You may resume regular meals tomorrow.  Today it is better to start with liquids and gradually work up to solid foods. ? ?You may eat anything you prefer, but it is better to start with liquids, then soup and crackers, and gradually work up to solid foods. ? ? ?Please notify your doctor immediately if you have any unusual bleeding, trouble breathing, redness and pain at the surgery site, drainage, fever, or pain not relieved by medication. ? ? ? ?Additional Instructions: ? ? ?Alternate tylenol '1000mg'$  every 6 hours with antiinflammatory  Aleve 2 tabs twice daily ? ?Keep green arm band on for 4 days ? ?Please contact your physician with any problems or Same Day Surgery at [phone], Monday through Friday 6 am to 4 pm, or [place] at [address] [employer] number at [phone].  ? ? ?

## 2021-05-08 NOTE — Anesthesia Procedure Notes (Signed)
Spinal ? ?Patient location during procedure: OR ?Start time: 05/08/2021 8:01 AM ?Reason for block: surgical anesthesia ?Staffing ?Performed: resident/CRNA  ?Preanesthetic Checklist ?Completed: patient identified, IV checked, site marked, risks and benefits discussed, surgical consent, monitors and equipment checked, pre-op evaluation and timeout performed ?Spinal Block ?Patient position: sitting ?Prep: ChloraPrep ?Patient monitoring: heart rate, cardiac monitor, continuous pulse ox and blood pressure ?Approach: midline ?Location: L3-4 ?Injection technique: single-shot ?Needle ?Needle type: Sprotte  ?Needle gauge: 24 G ?Needle length: 9 cm ?Assessment ?Sensory level: T4 ?Events: CSF return ?Additional Notes ?Negative heme, negative paresthesia, no pain with injection, good free flow CSF pre/post.  Atraumatic x1. Pt tolerated well ? ? ? ?

## 2021-05-08 NOTE — Evaluation (Signed)
Physical Therapy Evaluation ?Patient Details ?Name: Edward Baird ?MRN: [id number] ?DOB: 11/16/1940 ?Today's Date: 05/08/2021 ? ?History of Present Illness ? Pt is s/p elective R unicompartmental TKA 05/08/21 planning for SDDC.  ?Clinical Impression ? Pt admitted with above diagnosis. Pt received sitting EOB getting changed into clothes. Pt agreeable to PT. Planning for [place] pending eval. Pt reports being indep at baseline with all aspects of mobility. Pt mod-I with bed mobility and STS with VC's for RW. Sensation to LT normal in bilat dermatomes, active use of quad via SLR (prior to standing). Pt able to perform step through gait with RW and consistent RLE heel strike at supervision level. Pt asc/desc 2x4 stairs after PT demo with spouse present. Pt safe and stable throughout with min VC's for LE sequencing and education provided to spouse on safe guarding techniques. Pt returned to sitting in recliner with performance and education on HEP (Reps, sets, frequency) with pt demonstrating understanding of exercises. Noted "squish" noise in R knee with knee flexion but no active bleeding or drainage from knee and pt denying pain. RN and surgeon notified. RLE knee position educated as well to prevent joint contractures. Pt safe for SDDC with needed DME. All questions addressed.  ? ?   ?   ? ?Recommendations for follow up therapy are one component of a multi-disciplinary discharge planning process, led by the attending physician.  Recommendations may be updated based on patient status, additional functional criteria and insurance authorization. ? ?Follow Up Recommendations Home health PT ? ?  ?Assistance Recommended at Discharge PRN  ?Patient can return home with the following ? A little help with walking and/or transfers;Assist for transportation;Help with stairs or ramp for entrance ? ?  ?Equipment Recommendations Rolling walker (2 wheels)  ?Recommendations for Other Services ?    ?  ?Functional Status Assessment Patient  has had a recent decline in their functional status and demonstrates the ability to make significant improvements in function in a reasonable and predictable amount of time.  ? ?  ?Precautions / Restrictions Precautions ?Precautions: Knee ?Precaution Booklet Issued: Yes (comment) ?Restrictions ?Weight Bearing Restrictions: Yes ?RLE Weight Bearing: Weight bearing as tolerated  ? ?  ? ?Mobility ? Bed Mobility ?Overal bed mobility: Modified Independent ?  ?  ?  ?  ?  ?  ?  ?Patient Response: Cooperative ? ?Transfers ?Overall transfer level: Needs assistance ?Equipment used: Rolling walker (2 wheels) ?Transfers: Sit to/from Stand ?Sit to Stand: Supervision ?  ?  ?  ?  ?  ?  ?  ? ?Ambulation/Gait ?Ambulation/Gait assistance: Supervision ?Gait Distance (Feet): 250 Feet ?Assistive device: Rolling walker (2 wheels) ?Gait Pattern/deviations: Step-through pattern, Decreased step length - right, Decreased step length - left, Decreased stance time - left ?  ?  ?  ?General Gait Details: Able to perform gait with consistent step through and heel strike on RLE. ? ?Stairs ?Stairs: Yes ?Stairs assistance: Supervision ?Stair Management: One rail Right, Step to pattern, Forwards ?Number of Stairs: 8 (2x4 steps) ?General stair comments: Safe performance. Intermittent, min VC's for sequencing of limbs. Education to spouse on pt guarding when asc/desc stairs. ? ?Wheelchair Mobility ?  ? ?Modified Rankin (Stroke Patients Only) ?  ? ?  ? ?Balance Overall balance assessment: Modified Independent ?  ?  ?  ?  ?  ?  ?  ?  ?  ?  ?  ?  ?  ?  ?  ?  ?  ?  ?   ? ? ? ?  Pertinent Vitals/Pain Pain Assessment ?Pain Assessment: No/denies pain  ? ? ?Home Living Family/patient expects to be discharged to:: Private residence ?Living Arrangements: Spouse/significant other ?Available Help at Discharge: Family ?Type of Home: House ?Home Access: Stairs to enter ?Entrance Stairs-Rails: Left ?Entrance Stairs-Number of Steps: 2 ?  ?Home Layout: One level ?Home  Equipment: Standard Walker;BSC/3in1;Crutches ?Additional Comments: has walking stick  ?  ?Prior Function Prior Level of Function : Independent/Modified Independent ?  ?  ?  ?  ?  ?  ?  ?  ?  ? ? ?Hand Dominance  ?   ? ?  ?Extremity/Trunk Assessment  ? Upper Extremity Assessment ?Upper Extremity Assessment: Overall WFL for tasks assessed ?  ? ?Lower Extremity Assessment ?Lower Extremity Assessment: Generalized weakness;RLE deficits/detail ?RLE Deficits / Details: R TKA ?RLE Sensation: WNL ?  ? ?Cervical / Trunk Assessment ?Cervical / Trunk Assessment: Normal  ?Communication  ? Communication: No difficulties  ?Cognition Arousal/Alertness: Awake/alert ?Behavior During Therapy: [facility] for tasks assessed/performed ?Overall Cognitive Status: Within Functional Limits for tasks assessed ?  ?  ?  ?  ?  ?  ?  ?  ?  ?  ?  ?  ?  ?  ?  ?  ?  ?  ?  ? ?  ?General Comments   ? ?  ?Exercises Total Joint Exercises ?Ankle Circles/Pumps: AROM, Strengthening, Both, 10 reps, Seated ?Quad Sets: Strengthening, Right, 10 reps, Supine ?Short Arc Quad: AROM, Strengthening, Right, 10 reps, Seated ?Heel Slides: AROM, Strengthening, Right, 10 reps, Seated ?Hip ABduction/ADduction: AROM, Strengthening, Right, Seated, 10 reps ?Straight Leg Raises: AROM, Strengthening, Right, Seated, 10 reps ?Long Arc Quad: AROM, Strengthening, Right, Seated, 10 reps ?Other Exercises ?Other Exercises: Role of PT in acute setting, WB precautions, HEP hand out (reps, sets, frequency), stairs training, car transfer, d/c recs  ? ?Assessment/Plan  ?  ?PT Assessment Patient needs continued PT services  ?PT Problem List Decreased strength;Decreased mobility;Decreased safety awareness;Decreased range of motion;Decreased activity tolerance;Decreased balance;Decreased knowledge of use of DME ? ?   ?  ?PT Treatment Interventions DME instruction;Therapeutic exercise;Gait training;Balance training;Stair training;Neuromuscular re-education;Functional mobility training;Therapeutic  activities;Patient/family education   ? ?PT Goals (Current goals can be found in the Care Plan section)  ?Acute Rehab PT Goals ?Patient Stated Goal: to go home ?PT Goal Formulation: With patient ?Time For Goal Achievement: 05/22/21 ?Potential to Achieve Goals: Good ? ?  ?Frequency BID ?  ? ? ?Co-evaluation   ?  ?  ?  ?  ? ? ?  ?AM-PAC PT "6 Clicks" Mobility  ?Outcome Measure Help needed turning from your back to your side while in a flat bed without using bedrails?: None ?Help needed moving from lying on your back to sitting on the side of a flat bed without using bedrails?: None ?Help needed moving to and from a bed to a chair (including a wheelchair)?: A Little ?Help needed standing up from a chair using your arms (e.g., wheelchair or bedside chair)?: A Little ?Help needed to walk in hospital room?: A Little ?Help needed climbing 3-5 steps with a railing? : A Little ?6 Click Score: 20 ? ?  ?End of Session Equipment Utilized During Treatment: Gait belt ?Activity Tolerance: Patient tolerated treatment well ?Patient left: in chair;with call bell/phone within reach;with nursing/sitter in room;with family/visitor present ?Nurse Communication: Mobility status ?PT Visit Diagnosis: Other abnormalities of gait and mobility (R26.89);Muscle weakness (generalized) (M62.81);Difficulty in walking, not elsewhere classified (R26.2) ?  ? ?Time: 1310-1350 ?PT Time Calculation (min) (ACUTE  ONLY): 40 min ? ? ?Charges:   PT Evaluation ?$PT Eval Low Complexity: 1 Low ?PT Treatments ?$Gait Training: 8-22 mins ?$Therapeutic Exercise: 8-22 mins ?  ?   ? ? ?Salem Caster. Fairly IV, PT, DPT ?Physical Therapist- [place]  ?[facility]  ?05/08/2021, 2:58 PM ? ?

## 2021-05-08 NOTE — TOC CM/SW Note (Signed)
Patient has orders to discharge home today. Ordered RW through [employer] to be delivered to the home per RN request. Patient was set up with [facility] prior to admission. No further concerns. CSW signing off. ? ?Dayton Scrape, [place] ?[phone] ? ?

## 2021-05-09 DIAGNOSIS — Z87442 Personal history of urinary calculi: Secondary | ICD-10-CM | POA: Diagnosis not present

## 2021-05-09 DIAGNOSIS — Z7901 Long term (current) use of anticoagulants: Secondary | ICD-10-CM | POA: Diagnosis not present

## 2021-05-09 DIAGNOSIS — Z96651 Presence of right artificial knee joint: Secondary | ICD-10-CM | POA: Diagnosis not present

## 2021-05-09 DIAGNOSIS — M109 Gout, unspecified: Secondary | ICD-10-CM | POA: Diagnosis not present

## 2021-05-09 DIAGNOSIS — Z471 Aftercare following joint replacement surgery: Secondary | ICD-10-CM | POA: Diagnosis not present

## 2021-05-09 DIAGNOSIS — E785 Hyperlipidemia, unspecified: Secondary | ICD-10-CM | POA: Diagnosis not present

## 2021-05-09 DIAGNOSIS — M1711 Unilateral primary osteoarthritis, right knee: Secondary | ICD-10-CM | POA: Diagnosis not present

## 2021-05-09 DIAGNOSIS — Z85038 Personal history of other malignant neoplasm of large intestine: Secondary | ICD-10-CM | POA: Diagnosis not present

## 2021-05-09 DIAGNOSIS — H25019 Cortical age-related cataract, unspecified eye: Secondary | ICD-10-CM | POA: Diagnosis not present

## 2021-05-09 DIAGNOSIS — M1990 Unspecified osteoarthritis, unspecified site: Secondary | ICD-10-CM | POA: Diagnosis not present

## 2021-05-10 ENCOUNTER — Encounter: Payer: Self-pay | Admitting: Surgery

## 2021-05-10 NOTE — Anesthesia Postprocedure Evaluation (Signed)
Anesthesia Post Note ? ?Patient: Edward Baird ? ?Procedure(s) Performed: UNICOMPARTMENTAL KNEE (Right: Knee) ? ?Patient location during evaluation: Nursing Unit ?Anesthesia Type: Spinal ?Level of consciousness: oriented and awake and alert ?Pain management: pain level controlled ?Vital Signs Assessment: post-procedure vital signs reviewed and stable ?Respiratory status: spontaneous breathing and respiratory function stable ?Cardiovascular status: blood pressure returned to baseline and stable ?Postop Assessment: no headache, no backache, no apparent nausea or vomiting and patient able to bend at knees ?Anesthetic complications: no ? ? ?No notable events documented. ? ? ?Last Vitals:  ?Vitals:  ? 05/08/21 1245 05/08/21 1451  ?BP: 118/64 123/78  ?Pulse: 68 70  ?Resp: 16 16  ?Temp:    ?SpO2: 100% 100%  ?  ?Last Pain:  ?Vitals:  ? 05/08/21 1451  ?TempSrc:   ?PainSc: 0-No pain  ? ? ?  ?  ?  ?  ?  ?  ? ?Hedda Slade ? ? ? ? ?

## 2021-05-11 DIAGNOSIS — Z471 Aftercare following joint replacement surgery: Secondary | ICD-10-CM | POA: Diagnosis not present

## 2021-06-20 DIAGNOSIS — M1711 Unilateral primary osteoarthritis, right knee: Secondary | ICD-10-CM | POA: Diagnosis not present

## 2021-06-20 DIAGNOSIS — Z96651 Presence of right artificial knee joint: Secondary | ICD-10-CM | POA: Diagnosis not present

## 2021-06-21 DIAGNOSIS — M1A00X1 Idiopathic chronic gout, unspecified site, with tophus (tophi): Secondary | ICD-10-CM | POA: Diagnosis not present

## 2021-07-06 DIAGNOSIS — D485 Neoplasm of uncertain behavior of skin: Secondary | ICD-10-CM | POA: Diagnosis not present

## 2021-07-06 DIAGNOSIS — C44311 Basal cell carcinoma of skin of nose: Secondary | ICD-10-CM | POA: Diagnosis not present

## 2021-08-14 DIAGNOSIS — H04123 Dry eye syndrome of bilateral lacrimal glands: Secondary | ICD-10-CM | POA: Diagnosis not present

## 2021-08-14 DIAGNOSIS — H0288B Meibomian gland dysfunction left eye, upper and lower eyelids: Secondary | ICD-10-CM | POA: Diagnosis not present

## 2021-08-14 DIAGNOSIS — H2513 Age-related nuclear cataract, bilateral: Secondary | ICD-10-CM | POA: Diagnosis not present

## 2021-08-14 DIAGNOSIS — H0288A Meibomian gland dysfunction right eye, upper and lower eyelids: Secondary | ICD-10-CM | POA: Diagnosis not present

## 2021-08-14 DIAGNOSIS — H5203 Hypermetropia, bilateral: Secondary | ICD-10-CM | POA: Diagnosis not present

## 2021-08-24 DIAGNOSIS — L821 Other seborrheic keratosis: Secondary | ICD-10-CM | POA: Diagnosis not present

## 2021-08-24 DIAGNOSIS — D225 Melanocytic nevi of trunk: Secondary | ICD-10-CM | POA: Diagnosis not present

## 2021-08-24 DIAGNOSIS — Z859 Personal history of malignant neoplasm, unspecified: Secondary | ICD-10-CM | POA: Diagnosis not present

## 2021-08-24 DIAGNOSIS — L570 Actinic keratosis: Secondary | ICD-10-CM | POA: Diagnosis not present

## 2021-08-24 DIAGNOSIS — C44311 Basal cell carcinoma of skin of nose: Secondary | ICD-10-CM | POA: Diagnosis not present

## 2021-08-24 DIAGNOSIS — Z09 Encounter for follow-up examination after completed treatment for conditions other than malignant neoplasm: Secondary | ICD-10-CM | POA: Diagnosis not present

## 2021-08-24 DIAGNOSIS — L578 Other skin changes due to chronic exposure to nonionizing radiation: Secondary | ICD-10-CM | POA: Diagnosis not present

## 2021-08-24 DIAGNOSIS — L720 Epidermal cyst: Secondary | ICD-10-CM | POA: Diagnosis not present

## 2021-08-24 DIAGNOSIS — Z86018 Personal history of other benign neoplasm: Secondary | ICD-10-CM | POA: Diagnosis not present

## 2021-08-24 DIAGNOSIS — Z85828 Personal history of other malignant neoplasm of skin: Secondary | ICD-10-CM | POA: Diagnosis not present

## 2021-08-28 DIAGNOSIS — C44311 Basal cell carcinoma of skin of nose: Secondary | ICD-10-CM | POA: Diagnosis not present

## 2021-09-24 DIAGNOSIS — M1A00X1 Idiopathic chronic gout, unspecified site, with tophus (tophi): Secondary | ICD-10-CM | POA: Diagnosis not present

## 2021-10-03 DIAGNOSIS — Z96651 Presence of right artificial knee joint: Secondary | ICD-10-CM | POA: Diagnosis not present

## 2021-10-03 DIAGNOSIS — M1711 Unilateral primary osteoarthritis, right knee: Secondary | ICD-10-CM | POA: Diagnosis not present

## 2021-10-10 DIAGNOSIS — Z0000 Encounter for general adult medical examination without abnormal findings: Secondary | ICD-10-CM | POA: Diagnosis not present

## 2021-10-10 DIAGNOSIS — R7302 Impaired glucose tolerance (oral): Secondary | ICD-10-CM | POA: Diagnosis not present

## 2021-10-10 DIAGNOSIS — Z1211 Encounter for screening for malignant neoplasm of colon: Secondary | ICD-10-CM | POA: Diagnosis not present

## 2021-10-10 DIAGNOSIS — N400 Enlarged prostate without lower urinary tract symptoms: Secondary | ICD-10-CM | POA: Diagnosis not present

## 2021-10-10 DIAGNOSIS — C189 Malignant neoplasm of colon, unspecified: Secondary | ICD-10-CM | POA: Diagnosis not present

## 2021-10-10 DIAGNOSIS — M1A9XX0 Chronic gout, unspecified, without tophus (tophi): Secondary | ICD-10-CM | POA: Diagnosis not present

## 2021-10-10 DIAGNOSIS — E785 Hyperlipidemia, unspecified: Secondary | ICD-10-CM | POA: Diagnosis not present

## 2021-10-10 DIAGNOSIS — N1831 Chronic kidney disease, stage 3a: Secondary | ICD-10-CM | POA: Diagnosis not present

## 2021-10-25 DIAGNOSIS — Z1211 Encounter for screening for malignant neoplasm of colon: Secondary | ICD-10-CM | POA: Diagnosis not present

## 2021-11-05 DIAGNOSIS — M1711 Unilateral primary osteoarthritis, right knee: Secondary | ICD-10-CM | POA: Diagnosis not present

## 2021-11-05 DIAGNOSIS — Z96651 Presence of right artificial knee joint: Secondary | ICD-10-CM | POA: Diagnosis not present

## 2021-11-19 DIAGNOSIS — M5136 Other intervertebral disc degeneration, lumbar region: Secondary | ICD-10-CM | POA: Diagnosis not present

## 2021-11-19 DIAGNOSIS — M25561 Pain in right knee: Secondary | ICD-10-CM | POA: Diagnosis not present

## 2021-11-19 DIAGNOSIS — M5416 Radiculopathy, lumbar region: Secondary | ICD-10-CM | POA: Diagnosis not present

## 2021-11-19 DIAGNOSIS — M8588 Other specified disorders of bone density and structure, other site: Secondary | ICD-10-CM | POA: Diagnosis not present

## 2021-11-19 DIAGNOSIS — G8929 Other chronic pain: Secondary | ICD-10-CM | POA: Diagnosis not present

## 2021-11-19 DIAGNOSIS — M1A00X1 Idiopathic chronic gout, unspecified site, with tophus (tophi): Secondary | ICD-10-CM | POA: Diagnosis not present

## 2021-11-22 DIAGNOSIS — R200 Anesthesia of skin: Secondary | ICD-10-CM | POA: Diagnosis not present

## 2021-11-22 DIAGNOSIS — R202 Paresthesia of skin: Secondary | ICD-10-CM | POA: Diagnosis not present

## 2021-11-22 DIAGNOSIS — Z7689 Persons encountering health services in other specified circumstances: Secondary | ICD-10-CM | POA: Diagnosis not present

## 2021-11-22 DIAGNOSIS — M5441 Lumbago with sciatica, right side: Secondary | ICD-10-CM | POA: Diagnosis not present

## 2021-11-22 DIAGNOSIS — M79604 Pain in right leg: Secondary | ICD-10-CM | POA: Diagnosis not present

## 2022-01-01 DIAGNOSIS — M109 Gout, unspecified: Secondary | ICD-10-CM | POA: Diagnosis not present

## 2022-01-01 DIAGNOSIS — Z87891 Personal history of nicotine dependence: Secondary | ICD-10-CM | POA: Diagnosis not present

## 2022-01-01 DIAGNOSIS — C189 Malignant neoplasm of colon, unspecified: Secondary | ICD-10-CM | POA: Diagnosis not present

## 2022-01-01 DIAGNOSIS — G8929 Other chronic pain: Secondary | ICD-10-CM | POA: Diagnosis not present

## 2022-01-10 DIAGNOSIS — M1A00X1 Idiopathic chronic gout, unspecified site, with tophus (tophi): Secondary | ICD-10-CM | POA: Diagnosis not present

## 2022-01-10 DIAGNOSIS — M7660 Achilles tendinitis, unspecified leg: Secondary | ICD-10-CM | POA: Diagnosis not present

## 2022-02-07 DIAGNOSIS — C44311 Basal cell carcinoma of skin of nose: Secondary | ICD-10-CM | POA: Diagnosis not present

## 2022-02-26 DIAGNOSIS — L578 Other skin changes due to chronic exposure to nonionizing radiation: Secondary | ICD-10-CM | POA: Diagnosis not present

## 2022-02-26 DIAGNOSIS — Z872 Personal history of diseases of the skin and subcutaneous tissue: Secondary | ICD-10-CM | POA: Diagnosis not present

## 2022-02-26 DIAGNOSIS — L570 Actinic keratosis: Secondary | ICD-10-CM | POA: Diagnosis not present

## 2022-02-26 DIAGNOSIS — C44329 Squamous cell carcinoma of skin of other parts of face: Secondary | ICD-10-CM | POA: Diagnosis not present

## 2022-02-26 DIAGNOSIS — Z859 Personal history of malignant neoplasm, unspecified: Secondary | ICD-10-CM | POA: Diagnosis not present

## 2022-02-26 DIAGNOSIS — Z85828 Personal history of other malignant neoplasm of skin: Secondary | ICD-10-CM | POA: Diagnosis not present

## 2022-02-26 DIAGNOSIS — D485 Neoplasm of uncertain behavior of skin: Secondary | ICD-10-CM | POA: Diagnosis not present

## 2022-02-26 DIAGNOSIS — Z86018 Personal history of other benign neoplasm: Secondary | ICD-10-CM | POA: Diagnosis not present

## 2022-03-22 DIAGNOSIS — C44320 Squamous cell carcinoma of skin of unspecified parts of face: Secondary | ICD-10-CM | POA: Diagnosis not present

## 2022-03-25 DIAGNOSIS — L905 Scar conditions and fibrosis of skin: Secondary | ICD-10-CM | POA: Diagnosis not present

## 2022-04-11 DIAGNOSIS — L570 Actinic keratosis: Secondary | ICD-10-CM | POA: Diagnosis not present

## 2022-04-23 DIAGNOSIS — M1A9XX0 Chronic gout, unspecified, without tophus (tophi): Secondary | ICD-10-CM | POA: Diagnosis not present

## 2022-04-23 DIAGNOSIS — C189 Malignant neoplasm of colon, unspecified: Secondary | ICD-10-CM | POA: Diagnosis not present

## 2022-04-23 DIAGNOSIS — N400 Enlarged prostate without lower urinary tract symptoms: Secondary | ICD-10-CM | POA: Diagnosis not present

## 2022-04-23 DIAGNOSIS — E785 Hyperlipidemia, unspecified: Secondary | ICD-10-CM | POA: Diagnosis not present

## 2022-04-23 DIAGNOSIS — R7302 Impaired glucose tolerance (oral): Secondary | ICD-10-CM | POA: Diagnosis not present

## 2022-04-23 DIAGNOSIS — N1831 Chronic kidney disease, stage 3a: Secondary | ICD-10-CM | POA: Diagnosis not present

## 2022-05-13 DIAGNOSIS — M1A9XX0 Chronic gout, unspecified, without tophus (tophi): Secondary | ICD-10-CM | POA: Diagnosis not present

## 2022-08-12 DIAGNOSIS — M1A9XX0 Chronic gout, unspecified, without tophus (tophi): Secondary | ICD-10-CM | POA: Diagnosis not present

## 2022-08-22 DIAGNOSIS — H903 Sensorineural hearing loss, bilateral: Secondary | ICD-10-CM | POA: Diagnosis not present

## 2022-08-22 DIAGNOSIS — H6123 Impacted cerumen, bilateral: Secondary | ICD-10-CM | POA: Diagnosis not present

## 2022-09-20 DIAGNOSIS — Z872 Personal history of diseases of the skin and subcutaneous tissue: Secondary | ICD-10-CM | POA: Diagnosis not present

## 2022-09-20 DIAGNOSIS — L578 Other skin changes due to chronic exposure to nonionizing radiation: Secondary | ICD-10-CM | POA: Diagnosis not present

## 2022-09-20 DIAGNOSIS — L570 Actinic keratosis: Secondary | ICD-10-CM | POA: Diagnosis not present

## 2022-09-20 DIAGNOSIS — Z859 Personal history of malignant neoplasm, unspecified: Secondary | ICD-10-CM | POA: Diagnosis not present

## 2022-09-20 DIAGNOSIS — Z86018 Personal history of other benign neoplasm: Secondary | ICD-10-CM | POA: Diagnosis not present

## 2022-09-20 DIAGNOSIS — Z85828 Personal history of other malignant neoplasm of skin: Secondary | ICD-10-CM | POA: Diagnosis not present

## 2022-10-30 DIAGNOSIS — C189 Malignant neoplasm of colon, unspecified: Secondary | ICD-10-CM | POA: Diagnosis not present

## 2022-10-30 DIAGNOSIS — M1A9XX0 Chronic gout, unspecified, without tophus (tophi): Secondary | ICD-10-CM | POA: Diagnosis not present

## 2022-10-30 DIAGNOSIS — Z1211 Encounter for screening for malignant neoplasm of colon: Secondary | ICD-10-CM | POA: Diagnosis not present

## 2022-10-30 DIAGNOSIS — R7302 Impaired glucose tolerance (oral): Secondary | ICD-10-CM | POA: Diagnosis not present

## 2022-10-30 DIAGNOSIS — N200 Calculus of kidney: Secondary | ICD-10-CM | POA: Diagnosis not present

## 2022-10-30 DIAGNOSIS — E785 Hyperlipidemia, unspecified: Secondary | ICD-10-CM | POA: Diagnosis not present

## 2022-10-30 DIAGNOSIS — N1831 Chronic kidney disease, stage 3a: Secondary | ICD-10-CM | POA: Diagnosis not present

## 2022-10-30 DIAGNOSIS — Z0000 Encounter for general adult medical examination without abnormal findings: Secondary | ICD-10-CM | POA: Diagnosis not present

## 2022-10-30 DIAGNOSIS — N400 Enlarged prostate without lower urinary tract symptoms: Secondary | ICD-10-CM | POA: Diagnosis not present

## 2022-11-14 DIAGNOSIS — Z1211 Encounter for screening for malignant neoplasm of colon: Secondary | ICD-10-CM | POA: Diagnosis not present

## 2022-12-04 ENCOUNTER — Ambulatory Visit: Payer: PPO | Admitting: Urology

## 2022-12-04 VITALS — BP 147/93 | HR 91 | Ht 68.0 in | Wt 184.4 lb

## 2022-12-04 DIAGNOSIS — Z09 Encounter for follow-up examination after completed treatment for conditions other than malignant neoplasm: Secondary | ICD-10-CM

## 2022-12-04 DIAGNOSIS — N401 Enlarged prostate with lower urinary tract symptoms: Secondary | ICD-10-CM

## 2022-12-04 DIAGNOSIS — Z87898 Personal history of other specified conditions: Secondary | ICD-10-CM

## 2022-12-04 DIAGNOSIS — Z87442 Personal history of urinary calculi: Secondary | ICD-10-CM | POA: Diagnosis not present

## 2022-12-04 DIAGNOSIS — Z87438 Personal history of other diseases of male genital organs: Secondary | ICD-10-CM

## 2022-12-04 LAB — URINALYSIS, COMPLETE
Bilirubin, UA: NEGATIVE
Glucose, UA: NEGATIVE
Ketones, UA: NEGATIVE
Leukocytes,UA: NEGATIVE
Nitrite, UA: NEGATIVE
Protein,UA: NEGATIVE
Specific Gravity, UA: 1.02 (ref 1.005–1.030)
Urobilinogen, Ur: 0.2 mg/dL (ref 0.2–1.0)
pH, UA: 5.5 (ref 5.0–7.5)

## 2022-12-04 LAB — MICROSCOPIC EXAMINATION

## 2022-12-04 LAB — BLADDER SCAN AMB NON-IMAGING: Scan Result: 52

## 2022-12-04 NOTE — Progress Notes (Signed)
Marcelle Overlie Plume,acting as a scribe for Vanna Scotland, MD.,have documented all relevant documentation on the behalf of Vanna Scotland, MD,as directed by  Vanna Scotland, MD while in the presence of Vanna Scotland, MD.  12/04/22 9:31 AM   Edward Baird 1940-05-10 [id number]  Referring provider: Marina Goodell, MD [address] [place],  [place] [address]  Chief Complaint  Patient presents with   Establish Care   Benign Prostatic Hypertrophy    HPI: 82 year-old male whop presents today to establish care.   He has a personal history of elevated PSA, BPH, and kidney stones. He was previously followed by Dr. Evelene Croon. He called and requested records from Dr. Amada Jupiter office, paid the fee, but we do no have access to those. At some point, he had a lithotripsy. There does not appear to be any available imaging since that time. He also has a personal history of elevated PSA. His last PSA was 14 in 2018 and 10 in 2016.  His urinalysis today is negative.   He has a history of BPH and he reports that urinary symptoms are well-managed and is pleased with current urinary function  He is not concerned about PSA levels and has elected not to proceed with further PSA testing. A biopsy was performed in the past, which showed benign results.   He has a history of kidney stones, with the last event occurring approximately 10 years ago. He believes the stones were uric acid-based, as one dissolved on its own.  He reports that in 2019, he was taken down to the lithotripsy truck but the stone could not be seen.  He wonders know if it was uric acid and he was able to dissolve it with apple cider vinegar.  Results for orders placed or performed in visit on 12/04/22  BLADDER SCAN AMB NON-IMAGING  Result Value Ref Range   Scan Result 52 ml      IPSS     Row Name 12/04/22 0800         International Prostate Symptom Score   How often have you had the sensation of not emptying your bladder? Not  at All     How often have you had to urinate less than every two hours? Not at All     How often have you found you stopped and started again several times when you urinated? Not at All     How often have you found it difficult to postpone urination? Not at All     How often have you had a weak urinary stream? Not at All     How often have you had to strain to start urination? Not at All     How many times did you typically get up at night to urinate? None     Total IPSS Score 0       Quality of Life due to urinary symptoms   If you were to spend the rest of your life with your urinary condition just the way it is now how would you feel about that? Delighted              Score:  1-7 Mild 8-19 Moderate 20-35 Severe     PMH: Past Medical History:  Diagnosis Date   Colon cancer (HCC)    a.) s/p partial colectomy   Family history of malignant hyperthermia    a.) paternal 1st cousin (3rd degree relative) with (+) MH; died intraoperatively here at [facility]  in 1982.   Gout    Hyperlipidemia    Hypertension     Surgical History: Past Surgical History:  Procedure Laterality Date   EXTRACORPOREAL SHOCK WAVE LITHOTRIPSY Right 10/23/2017   Procedure: EXTRACORPOREAL SHOCK WAVE LITHOTRIPSY (ESWL);  Surgeon: Orson Ape, MD;  Location: [facility] ORS;  Service: Urology;  Laterality: Right;   LITHOTRIPSY     NASAL POLYP SURGERY     PARTIAL COLECTOMY     double resectomy   PARTIAL KNEE ARTHROPLASTY Right 05/08/2021   Procedure: UNICOMPARTMENTAL KNEE;  Surgeon: Christena Flake, MD;  Location: [facility] ORS;  Service: Orthopedics;  Laterality: Right;    Home Medications:  Allergies as of 12/04/2022   No Known Allergies      Medication List        Accurate as of December 04, 2022  9:31 AM. If you have any questions, ask your nurse or doctor.          STOP taking these medications    apixaban 2.5 MG Tabs tablet Commonly known as: Eliquis   ASTAXANTHIN PO   CHERRY PO    colchicine 0.6 MG tablet   Garlic 500 MG Tabs   GLUCOSAMINE CHONDR COMPLEX PO   oxyCODONE 5 MG immediate release tablet Commonly known as: Roxicodone       TAKE these medications    allopurinol 300 MG tablet Commonly known as: ZYLOPRIM Take by mouth. Take 1.5 tablets (450 mg total) by mouth once daily What changed: Another medication with the same name was removed. Continue taking this medication, and follow the directions you see here.   FLAX SEED OIL PO Take 1 capsule by mouth daily. 1000 mg   HM LOPERAMIDE HCL PO Take by mouth. 3 times daily   magnesium 30 MG tablet Take 30 mg by mouth 2 (two) times daily.   Milk Thistle Xtra Caps Take 1 capsule by mouth daily.   OVER THE COUNTER MEDICATION Tomato paste and olive oil 1 tsp, parsley flakes 1 tsp, 1 tsp black strap molasses, probiotic, Pygium 4000 mg   Saw Palmetto Caps Take 1 capsule by mouth daily.   TURMERIC PO Take by mouth. daily   Vitamin D3 25 MCG (1000 UT) Caps Take 1,000 Units by mouth daily.   Zinc 50 MG Tabs Take by mouth. daily        Family History: Family History  Problem Relation Age of Onset   Hypertension Mother    Heart disease Mother    Heart disease Father    Dementia Father    Aortic aneurysm Brother    Anemia Sister     Social History:  reports that he has quit smoking. He quit smokeless tobacco use about 28 years ago.  His smokeless tobacco use included snuff and chew. He reports that he does not drink alcohol and does not use drugs.   Physical Exam: BP (!) 147/93   Pulse 91   Ht 5\' 8"  (1.727 m)   Wt 184 lb 6 oz (83.6 kg)   BMI 28.03 kg/m   Constitutional:  Alert and oriented, No acute distress. HEENT: [address] [place] AT, moist mucus membranes.  Trachea midline, no masses. Neurologic: Grossly intact, no focal deficits, moving all 4 extremities. Psychiatric: Normal mood and affect.   Assessment & Plan:    1. History of enlarged prostate - He is asymptomatic - He takes saw  palmetto - He is pleased with his urinary symptoms.  - His urinalysis is negative. His PVR is 0  2. History of elevated PSA - He seems to have a good understanding of PSA screening guidelines - He has elected not to proceed with any further PSA testing - He has had a negative prostate biopsy in the past  3. History of kidney stones - Discussed preventative measures - He has not had a stone in many years - He will inform us if he has any issues and we will hold off on any further imaging for the time being -Primary goal today for him was to establish care today if he does have an issue, he has access to care for this acute issue  Return if symptoms worsen or fail to improve.  I have reviewed the above documentation for accuracy and completeness, and I agree with the above.   Vanna Scotland, MD   [facility] [address], [address] [place], [place] [address] [phone]

## 2023-03-04 DIAGNOSIS — L821 Other seborrheic keratosis: Secondary | ICD-10-CM | POA: Diagnosis not present

## 2023-03-04 DIAGNOSIS — L570 Actinic keratosis: Secondary | ICD-10-CM | POA: Diagnosis not present

## 2023-04-08 DIAGNOSIS — M1A9XX0 Chronic gout, unspecified, without tophus (tophi): Secondary | ICD-10-CM | POA: Diagnosis not present

## 2023-05-01 DIAGNOSIS — N200 Calculus of kidney: Secondary | ICD-10-CM | POA: Diagnosis not present

## 2023-05-01 DIAGNOSIS — C189 Malignant neoplasm of colon, unspecified: Secondary | ICD-10-CM | POA: Diagnosis not present

## 2023-05-01 DIAGNOSIS — N400 Enlarged prostate without lower urinary tract symptoms: Secondary | ICD-10-CM | POA: Diagnosis not present

## 2023-05-01 DIAGNOSIS — M1A9XX0 Chronic gout, unspecified, without tophus (tophi): Secondary | ICD-10-CM | POA: Diagnosis not present

## 2023-05-01 DIAGNOSIS — N1831 Chronic kidney disease, stage 3a: Secondary | ICD-10-CM | POA: Diagnosis not present

## 2023-05-01 DIAGNOSIS — E785 Hyperlipidemia, unspecified: Secondary | ICD-10-CM | POA: Diagnosis not present

## 2023-05-01 DIAGNOSIS — R7302 Impaired glucose tolerance (oral): Secondary | ICD-10-CM | POA: Diagnosis not present

## 2023-05-19 DIAGNOSIS — E875 Hyperkalemia: Secondary | ICD-10-CM | POA: Diagnosis not present

## 2023-07-28 IMAGING — DX DG KNEE 1-2V PORT*R*
2 series · 2 of 2 positions shown · non-contrast
Comparison: None Available.

CLINICAL DATA: Status post right knee arthroplasty

EXAM:
PORTABLE RIGHT KNEE - 1-2 VIEW

[knee ap]
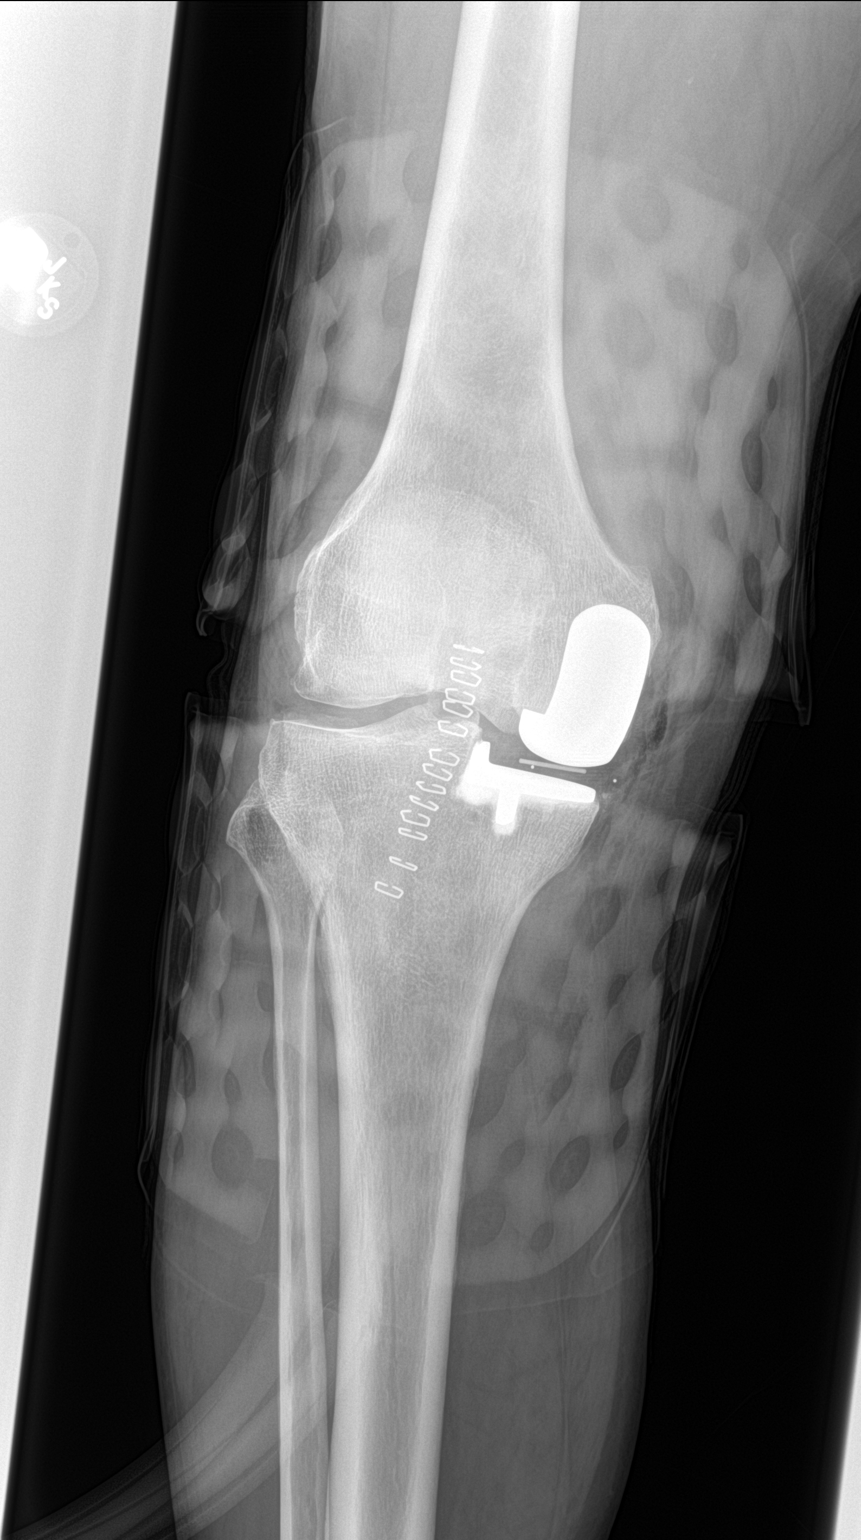

[knee lat]
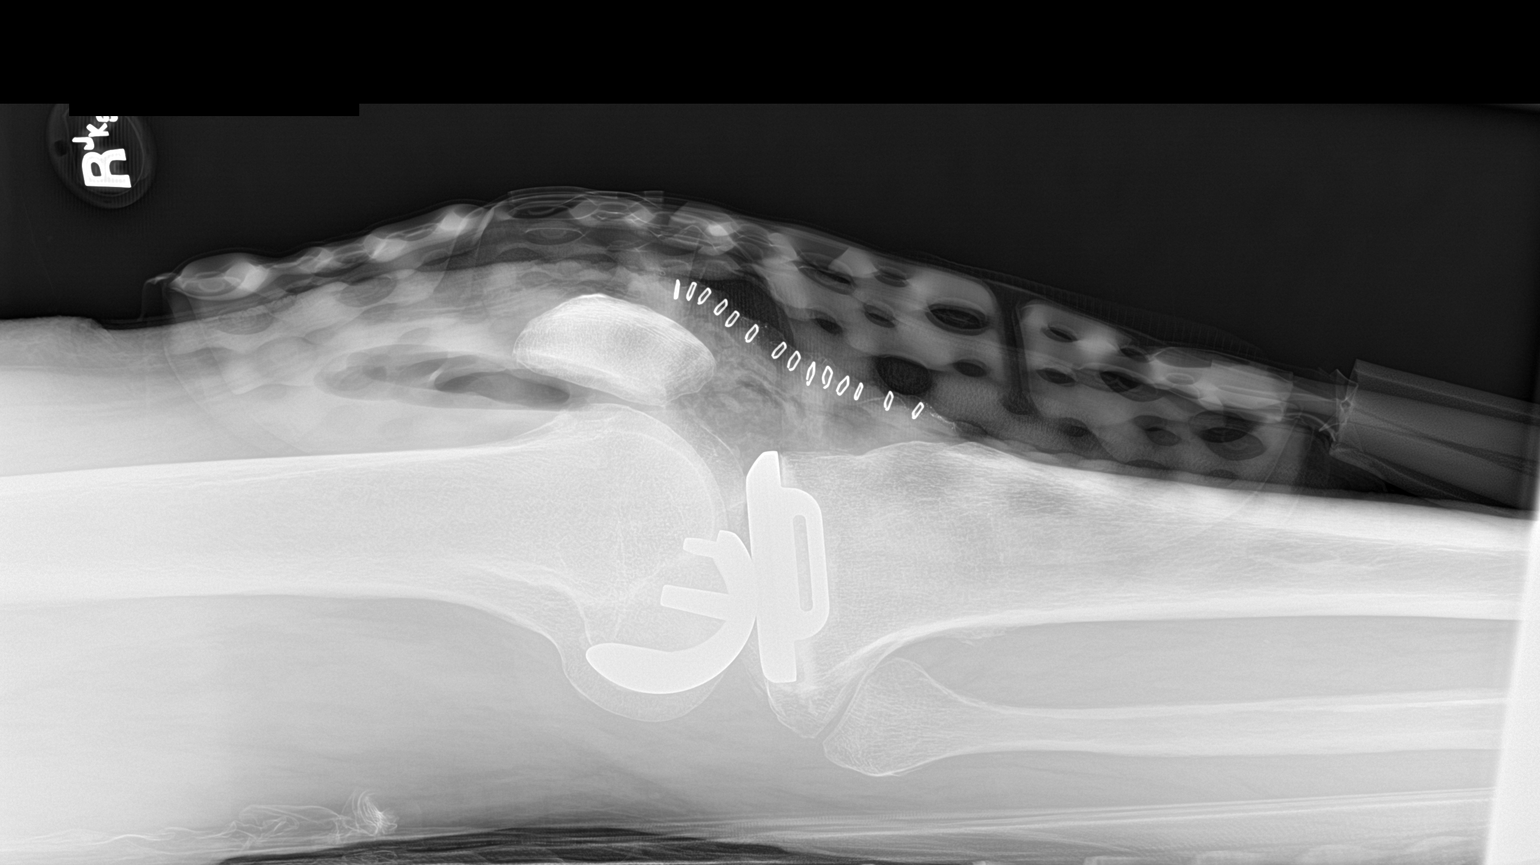

[2 of 2 positions shown; findings below may reference images not displayed]

FINDINGS: There is partial right knee arthroplasty in the medial compartment.
No recent fracture is seen. There is pocket of air in the
suprapatellar bursa. Skin staples are noted in the anterior aspect
of right knee.
IMPRESSION: Status post partial arthroplasty in the medial compartment of right
knee.

## 2023-08-07 DIAGNOSIS — R7302 Impaired glucose tolerance (oral): Secondary | ICD-10-CM | POA: Diagnosis not present

## 2023-08-07 DIAGNOSIS — H903 Sensorineural hearing loss, bilateral: Secondary | ICD-10-CM | POA: Diagnosis not present

## 2023-08-07 DIAGNOSIS — E785 Hyperlipidemia, unspecified: Secondary | ICD-10-CM | POA: Diagnosis not present

## 2023-08-07 DIAGNOSIS — N1831 Chronic kidney disease, stage 3a: Secondary | ICD-10-CM | POA: Diagnosis not present

## 2023-09-23 DIAGNOSIS — Z85828 Personal history of other malignant neoplasm of skin: Secondary | ICD-10-CM | POA: Diagnosis not present

## 2023-09-23 DIAGNOSIS — Z872 Personal history of diseases of the skin and subcutaneous tissue: Secondary | ICD-10-CM | POA: Diagnosis not present

## 2023-09-23 DIAGNOSIS — L578 Other skin changes due to chronic exposure to nonionizing radiation: Secondary | ICD-10-CM | POA: Diagnosis not present

## 2023-09-23 DIAGNOSIS — Z859 Personal history of malignant neoplasm, unspecified: Secondary | ICD-10-CM | POA: Diagnosis not present

## 2023-09-23 DIAGNOSIS — Z86018 Personal history of other benign neoplasm: Secondary | ICD-10-CM | POA: Diagnosis not present

## 2023-09-23 DIAGNOSIS — L570 Actinic keratosis: Secondary | ICD-10-CM | POA: Diagnosis not present

## 2023-11-04 DIAGNOSIS — N200 Calculus of kidney: Secondary | ICD-10-CM | POA: Diagnosis not present

## 2023-11-04 DIAGNOSIS — M1A9XX0 Chronic gout, unspecified, without tophus (tophi): Secondary | ICD-10-CM | POA: Diagnosis not present

## 2023-11-04 DIAGNOSIS — N1831 Chronic kidney disease, stage 3a: Secondary | ICD-10-CM | POA: Diagnosis not present

## 2023-11-04 DIAGNOSIS — Z0000 Encounter for general adult medical examination without abnormal findings: Secondary | ICD-10-CM | POA: Diagnosis not present

## 2023-11-04 DIAGNOSIS — R7302 Impaired glucose tolerance (oral): Secondary | ICD-10-CM | POA: Diagnosis not present

## 2023-11-04 DIAGNOSIS — N400 Enlarged prostate without lower urinary tract symptoms: Secondary | ICD-10-CM | POA: Diagnosis not present

## 2023-11-04 DIAGNOSIS — E785 Hyperlipidemia, unspecified: Secondary | ICD-10-CM | POA: Diagnosis not present

## 2023-11-04 DIAGNOSIS — C189 Malignant neoplasm of colon, unspecified: Secondary | ICD-10-CM | POA: Diagnosis not present
